# Patient Record
Sex: Male | Born: 1975 | Race: White | Hispanic: No | Marital: Married | Smoking: Current every day smoker
Health system: Southern US, Community
[De-identification: ages and names within clinical notes are randomized; demographics above are authoritative.]

## PROBLEM LIST (undated history)

## (undated) DIAGNOSIS — E669 Obesity, unspecified: Secondary | ICD-10-CM

## (undated) DIAGNOSIS — I10 Essential (primary) hypertension: Secondary | ICD-10-CM

## (undated) DIAGNOSIS — T7840XA Allergy, unspecified, initial encounter: Secondary | ICD-10-CM

## (undated) HISTORY — DX: Essential (primary) hypertension: I10

## (undated) HISTORY — PX: HERNIA REPAIR: SHX51

## (undated) HISTORY — PX: SPINE SURGERY: SHX786

## (undated) HISTORY — PX: APPENDECTOMY: SHX54

## (undated) HISTORY — PX: BACK SURGERY: SHX140

## (undated) HISTORY — DX: Allergy, unspecified, initial encounter: T78.40XA

## (undated) HISTORY — DX: Obesity, unspecified: E66.9

## (undated) HISTORY — PX: TONSILLECTOMY: SUR1361

---

## 1999-07-13 HISTORY — PX: TUMOR REMOVAL: SHX12

## 2006-06-14 ENCOUNTER — Encounter: Admission: RE | Admit: 2006-06-14 | Discharge: 2006-06-14 | Payer: Self-pay | Admitting: Neurosurgery

## 2006-08-10 ENCOUNTER — Inpatient Hospital Stay (HOSPITAL_COMMUNITY): Admission: RE | Admit: 2006-08-10 | Discharge: 2006-08-18 | Payer: Self-pay | Admitting: Neurosurgery

## 2006-08-10 ENCOUNTER — Ambulatory Visit: Payer: Self-pay | Admitting: Emergency Medicine

## 2006-08-15 ENCOUNTER — Ambulatory Visit: Payer: Self-pay | Admitting: Physical Medicine & Rehabilitation

## 2006-08-17 ENCOUNTER — Ambulatory Visit (HOSPITAL_COMMUNITY): Admission: RE | Admit: 2006-08-17 | Discharge: 2006-08-17 | Payer: Self-pay | Admitting: Emergency Medicine

## 2006-08-17 ENCOUNTER — Encounter (INDEPENDENT_AMBULATORY_CARE_PROVIDER_SITE_OTHER): Payer: Self-pay | Admitting: Specialist

## 2006-08-18 ENCOUNTER — Encounter (INDEPENDENT_AMBULATORY_CARE_PROVIDER_SITE_OTHER): Payer: Self-pay | Admitting: *Deleted

## 2006-09-02 ENCOUNTER — Ambulatory Visit: Payer: Self-pay | Admitting: Internal Medicine

## 2006-09-06 ENCOUNTER — Ambulatory Visit: Payer: Self-pay | Admitting: Emergency Medicine

## 2010-11-24 NOTE — H&P (Signed)
NAME:  [name], [name] NO.:  [id number]   MEDICAL RECORD NO.:  [id number]          PATIENT TYPE:  INP   LOCATION:  3172                         FACILITY:  [facility]   PHYSICIAN:  Hilda Lias, M.D.   DATE OF BIRTH:  December 19, 1975   DATE OF ADMISSION:  08/10/2006  DATE OF DISCHARGE:                              HISTORY & PHYSICAL   Mr. Humber is a gentleman who was seen in my office in November of  2007 and later on two weeks ago because of chronic complaint of back  pain with radiation to the left leg at the beginning and then down to  the right leg since he was in an accident back on September 30, 2005.  Patient had every single conservative treatment including medication,  physical therapy, epidural injection with no improvement.  He is getting  worse.  We saw him, we talked to him, we did a discogram and because of  the findings, he wants to proceed with surgery.   PAST SURGICAL HISTORY:  1. Tonsillectomy.  2. 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   HE IS ALLERGIC TO SULFA, ERYTHROMYCIN AND PENICILLIN.   Patient drinks socially, he smokes a pack a day.   FAMILY HISTORY:  There is a history of high blood pressure in his  family.   REVIEW OF SYSTEMS:  Positive back pain and leg pain.   PHYSICAL EXAMINATION:  HEAD/EARS/NOSE/THROAT:  Normal.  NECK:  Normal.  LUNGS:  Clear.  HEART SOUNDS:  Normal.  ABDOMEN:  Normal.  EXTREMITIES:  Normal pulses.  NEURO/MENTAL STATUS EXAM:  Psychologically normal.  He  has decreased in flexibility of the lower spine.  There is straight leg  raising, SLR is positive about 30 degrees bilaterally.  He has a mild  weak dorsiflexion.   Lumbar spine x-ray shows severe case of degenerative disc disease at L4-  L5 and L5-S1.  Discogram was highly positive for abnormality in the disc  at the levels 4-5, 5-1 with 3-4 being completely normal.   CLINICAL IMPRESSION:  Degenerative disc disease L4-5, L5-S1 with chronic  radiculopathy.   RECOMMENDATIONS:  The patient is being admitted for surgery.  Procedure  will be bilateral 4-5, 5-1 discectomy, interbody fusion with cage and  pedicle screws.  He knows about the risks such as infection, CSF leak,  no improvement whatsoever, need for further surgery, failure to heal  because the history of smoking plus obesity, damage to the vessels of  the abdomen, failure of the hardware.           ______________________________  Hilda Lias, M.D.    EB/MEDQ  D:  08/10/2006  T:  08/10/2006  Job:  [id number]

## 2010-11-24 NOTE — Op Note (Signed)
NAME:  JAD, JOHANSSON             ACCOUNT NO.:  [id number]   MEDICAL RECORD NO.:  [id number]          PATIENT TYPE:  INP   LOCATION:  5022                         FACILITY:  [facility]   PHYSICIAN:  Hilda Lias, M.D.   DATE OF BIRTH:  [birth year]   DATE OF PROCEDURE:  08/10/2006  DATE OF DISCHARGE:                               OPERATIVE REPORT   PREOPERATIVE DIAGNOSIS:  Degenerative disk disease, L4-L5, L5-S1, with a  chronic radiculopathy.   POSTOPERATIVE DIAGNOSIS:  Degenerative disk disease, L4-L5, L5-S1, with  a chronic radiculopathy.   PROCEDURE PERFORMED:  Bilateral L4-L5 laminectomy and facetectomy;  bilateral L4-L5 diskectomy; interbody fusion with cages; pedicle screws  L4-L5 and L5-S1; posterolateral arthrodesis; video, Cell-Saver, C-arm.   SURGEON:  Hilda Lias, M.D.   ASSISTANT SURGEON:  Coletta Memos, M.D.   CLINICAL HISTORY:  The patient was admitted because of back pain  radiating to both legs.  This problem has been going on for a year after  he fell at work.  He has failed with conservative treatment.  Surgery  was advised.  The risks were explained to him on physical.   DESCRIPTION OF PROCEDURE:  Mr. Shorey was taken to the operating room.  He was prepped appropriately.  The skin was cleaned with DuraPrep.  Drapes were applied.  An incision from L3 down to L5-S1 was made.  Bulky  muscles were protected all the way laterally.  X-ray showed that indeed  we were at the level of L5-S1.  From then on, we removed the spinous  processes off of L4 and L5 as well as the lamina and the facet.  We  found quite a bit of adhesions.  We entered the disk space at the level  of L4-L5 bilaterally and large amounts of degenerative disk were  removed.  Total diskectomy was done with removal of the endplate.  From  then on, we proceeded at the level of L5-S1 with the same findings of  degenerative disk.   Having the cleaned the disk spaces with curets, we introduced at  the  level of L4-L5 two cages of 14 x 22 with autograft and BMP inside.  At  the level of L5-S1, we introduced two __________ of 12 x 22.  Then in  the midline as well as laterally, a mix of BMP and autograft was used to  accomplish the fusion.  Then using the C-arm, first in the AP view and  then laterally, we probed the pedicles of L4, L5 and S1.  This was  followed by a tap; and, at the end, we introduced six screws of 6.5 x  40.  AP and lateral showed good position of the pedicle screws.  We  investigated the other pedicles and there was no perforation of the  medial wall.  The nerves were in good position.  From then on, the  pedicle screws were secured in place using a rod with caps on top.  Then  we went laterally and we removed the periosteum of the transverse  processes of L4, L5, and the ala of the sacrum.  Then a mix  of BMP and  autograft was used for the arthrodesis.  The area was irrigated.  Fentanyl was left in the epidural space.  The wound was closed with  Vicryl and Steri-Strips.          ______________________________  Hilda Lias, M.D.    EB/MEDQ  D:  08/10/2006  T:  08/11/2006  Job:  [id number]

## 2010-11-27 NOTE — Assessment & Plan Note (Signed)
[facility]                             PULMONARY OFFICE NOTE   NAME:[name], [name]                    MRN:          [id number]  DATE:09/02/2006                            DOB:          [birth year]    HISTORY OF PRESENT ILLNESS:  This is a 35 year old male patient that was  recently hospitalized, January 30 through August 18, 2006, for back  surgery.  The patient had back pain, radiating to both legs, ongoing for  several months after he had an accident prior to his admission.  The  patient was found to have degenerative disc disease and underwent an L4-  L5, L5-S1 fusion.  Immediately after surgery, the patient developed a  high temperature and chest x-ray showed infiltrates, consistent with  pneumonia.  The patient was started on antibiotic therapy and pulmonary  consult was obtained.  The patient was seen by Dr. Delton Coombes and  subsequently underwent a bronchoscopy on August 18, 2006.  Pathology  was negative.  Since discharge, patient reports that he has continued to  have a persistent cough, despite finishing his antibiotics.  The patient  denies any purulent sputum, fever, chest pain, shortness of breath or  hemoptysis.   PAST MEDICAL HISTORY:  Degenerative disc disease, status post surgery,  as above.  Otherwise past medical history is unremarkable.   CURRENT MEDICATIONS:  1. Percocet p.r.n.  2. Diovan p.r.n.  3. Neurontin daily.   DRUG ALLERGIES:  No known drug allergies.   SOCIAL HISTORY:  The patient is single, has one child.  Prior to  admission, drank about a six-pack of beer a week.  Denies any drug use.  The patient does smoke approximately one pack of cigarettes a day over  the last 10-12 years.   FAMILY HISTORY:  Negative for cancer, diabetes or lung disease.  Positive for coronary artery disease.   PHYSICAL EXAM:  The patient is an obese male, in no acute distress.  He  is afebrile with stable vital signs.  HEENT:   Unremarkable.  NECK:  Supple without cervical adenopathy.  No JVD.  LUNGS:  The lung fields reveal coarse breath sounds bilaterally, without  any wheezing or crackles.  CARDIAC:  Regular rate and rhythm.  ABDOMEN:  Soft and nontender and obese.  The patient does have a back  corset on.  EXTREMITIES:  Warm without any calf tenderness, cyanosis, clubbing.  There is trace edema with venous insufficiency changes.   DATA:  Chest x-ray on August 15, 2006, showed right upper lobe  infiltrate.  Bronch:  Negative AFB.  Blood culture times two negative.  Respiratory culture was negative.  Negative yeast or fungus.  Negative  P. carinii.   IMPRESSION AND PLAN:  Right upper and right lower lobe infiltrates,  consistent with pneumonia.  The patient is now status post bronchoscopy  with negative pathology.  The patient has now finished a course of  antibiotics and does appear to be improving; however, has a persistent  cough.  The patient was sent down for a followup chest x-ray, which is  pending at the time of this  dictation.  The patient will begin on  Mucinex DM twice daily.  May use Tessalon Perles as needed for cough.  To note:  The patient is taking Percocet on a pretty regular basis for  his pain management for his back surgery.  I did not want to add any  other narcotic cough syrup at this time.  We will add in Protonix 40 mg  daily for any residual reflux that could be irritating the airways from  persistent coughing.  The patient will return back next week with Dr.  Delton Coombes as scheduled, or sooner, if needed.      Corey Oaks, NP  Electronically Signed      Leslye Peer, MD  Electronically Signed   TP/MedQ  DD: 09/02/2006  DT: 09/02/2006  Job #: [id number]

## 2010-11-27 NOTE — Op Note (Signed)
NAME:  [name], [name]             ACCOUNT NO.:  [id number]   MEDICAL RECORD NO.:  [id number]          PATIENT TYPE:  INP   LOCATION:  5022                         FACILITY:  [facility]   PHYSICIAN:  Leslye Peer, MD    DATE OF BIRTH:  08-05-1975   DATE OF PROCEDURE:  08/24/2006  DATE OF DISCHARGE:  08/18/2006                               OPERATIVE REPORT   PROCEDURES:  Fiberoptic bronchoscopy with lavage and biopsies.   OPERATOR:  Levy Pupa, M.D.   INDICATIONS:  Right upper lobe and right lower lobe infiltrates on CAT  scan.   MEDICATIONS GIVEN:  Fentanyl 150 mcg IV in divided doses, Versed 12 mg  IV in divided doses.  1% lidocaine to the bronchoalveolar tree 2%  lidocaine to the right naris.   CONSENT:  This was obtained from the patient.  A signed copy is on his  hospital chart.   PROCEDURE DETAILS:  After informed consent was obtained, conscious  sedation was initiated as indicated above fiberoptic bronchoscope was  initially introduced to the right naris, but would not pass it was  subsequently introduced successfully to the oropharynx.  The posterior  pharynx was well visualized.  The cords were within normal limits and  moved normally with phonation and with deep inspiration.  Trachea was  intubated without difficulty.  Local anesthesia was achieved with 1%  lidocaine, the trachea and bilateral mainstem bronchi were inspected.  The left mainstem bronchus was within normal limits. The right mainstem  bronchus was within normal limits as well.  The left side was fully  examined including the left upper lobe and segmental bronchi and  lingular and left lower lobe bronchi and segmental bronchi.  There were  no endobronchial lesions or abnormal secretions noted.  The right-sided  exam showed no abnormalities in the right middle lobe, right lower lobe,  bronchus intermedius or in the segmental airways.  Attention was then  turned to the right upper lobe.  The posterior  segment of the right  upper lobe showed tannish-white exudative secretions.  There were no  endobronchial lesions noted.  Secretions were easily suctioned.  Brushings for microbiology were taken from posterior segment of the  right upper lobe and transbronchial biopsies were also performed on the  posterior segment of the right upper lobe.  Bronchoalveolar lavage was  performed in this region as well with 30 mL of normal saline instilled  and 10 mL returned.  This will be sent for microbiology.  A  postprocedural chest x-ray was pending at time of this dictation.  The  patient tolerated the procedure well.  There was approximately 2 mL  blood loss total with good hemostasis by the end of the case.  He  returned to the recovery room in good condition.   SAMPLES:  1. Bronchial brushings from the right upper lobe.  2. Transbronchial biopsies from the right upper lobe.  3. Bronchoalveolar lavage from the right upper lobe.   PLANS:  I will follow-up the results of [name] pathology and  microbiology with him in the office in approximately 10-14 days.  If  there are any abnormalities in his testing, I will call him sooner.      Leslye Peer, MD  Electronically Signed     RSB/MEDQ  D:  08/24/2006  T:  08/24/2006  Job:  [id number]

## 2010-11-27 NOTE — Assessment & Plan Note (Signed)
[facility]                             PULMONARY OFFICE NOTE   NAME:[name], [name]                    MRN:          [id number]  DATE:09/06/2006                            DOB:          07/30/1975    SUBJECTIVE:  [name] is a 35 year old gentleman seen initially in  the hospital for right upper lobe and lower lobe pulmonary infiltrates  following an elective back surgery performed by Dr. Jeral Fruit. I performed  fiberoptic bronchoscopy to further evaluate the nodular infiltrates.  Pathology and microbiology have all been negative to date. He returns  today for a regular followup of a dry cough that has been problematic  since his hospitalization. He is otherwise doing well, he is not having  any dyspnea. He continues to cough and has paroxysms especially at night  when he lies flat. He is not producing any sputum. He denies chest pain  but does have back pain when his cough is severe at his surgical site.   MEDICATIONS:  1. Percocet p.r.n.  2. Neurontin 300 mg t.i.d.  3. Protonix 40 mg daily.   PHYSICAL EXAMINATION:  GENERAL:  This is an obese, comfortable gentleman  in no distress.  VITAL SIGNS:  His weight is 288 pounds, temperature 97.6, blood pressure  112/78, heart rate 95, SPO2 98% on room air.  HEENT:  His oropharynx is clear. There is mild posterior pharyngeal  erythema but no lesion noted.  NECK:  Supple without lymphadenopathy or stridor.  LUNGS:  Clear to auscultation bilaterally.  HEART:  Regular rate and rhythm without murmur.  ABDOMEN:  Benign. He has a back brace in place.  EXTREMITIES:  No cyanosis, clubbing or edema.  NEUROLOGIC:  Grossly nonfocal exam.   Chest x-ray performed on September 02, 2006 on an office visit showed  resolution of his right upper lobe and lower lobe parenchymal  infiltrates. His transbronchial biopsies and the microbiology from his  brushings and bronchioalveolar lavage from our bronchoscopy are  all  negative. Cultures for AFB are pending.   IMPRESSION:  1. Resolved right upper lobe and lower lobe aspiration pneumonitis      following his general anesthesia for his back surgery.  2. Cough that appears to be influenced primarily by gastroesophageal      reflux disease although there may also be a component of some upper      airway irritation due to his recent intubation.   PLAN:  1. Discontinue Mucinex.  2. Continue Protonix 40 mg daily.  3. I will start Tussionex for shortterm symptomatic relief.  4. I expect his cough to resolve with the interventions above. He will      return to see me if his cough persists.  5. I will followup [name] AFB results to completion and then      call him with the final report.     Leslye Peer, MD  Electronically Signed    RSB/MedQ  DD: 09/06/2006  DT: 09/06/2006  Job #: [id number]   cc:   Hilda Lias, M.D.

## 2010-11-27 NOTE — Discharge Summary (Signed)
NAME:  [name], [name]             ACCOUNT NO.:  [id number]   MEDICAL RECORD NO.:  [id number]          PATIENT TYPE:  INP   LOCATION:  5022                         FACILITY:  [facility]   PHYSICIAN:  Hilda Lias, M.D.   DATE OF BIRTH:  1975-08-30   DATE OF ADMISSION:  08/10/2006  DATE OF DISCHARGE:  08/18/2006                               DISCHARGE SUMMARY   ADMISSION DIAGNOSIS:  Degenerative disk disease, L4-5, L5-S1 with a  chronic back pain and chronic radiculopathy.   DISCHARGE DIAGNOSES:  1. Degenerative disk disease, L4-5, L5-S1 with a chronic back pain and      chronic radiculopathy.  2. Pneumonia.   HISTORY:  The patient was admitted because of back pain radiating to  both the legs which had been going on for several months after the  accident.  He had failed with conservative treatment.  Because of the  findings, surgery was advised.   Laboratory showed the white cells were elevated to 11,000.  The x-ray of  the chest showed 2 pneumonias.  The blood cultures were negative.   HOSPITAL COURSE:  The patient was taken to surgery and L4-5 and L5-S1  diskectomy and fusion was done.  Immediately after surgery, the patient  developed high temperature.  He was found by x-ray that he had two  pneumonias.  The patient has a history of pneumonia.  He was seen by  pulmo.  He had a bronchoscopy yesterday.  Today he is feeling great.  Has no fever.  He wants to go home.  We are going to contact [facility] to see if they are ready to send him home and if that is  the case, they will give him the medication that he would require at  home for the pneumonia.   CONDITION ON DISCHARGE:  Improving.   MEDICATIONS:  From out point of view:  Percocet, diazepam and Neurontin.  We will wait for the pulmonologist to add if any antibiotics.   DIET:  He was encouraged to lose weight.   ACTIVITY:  Not to drive for at least a week.   FOLLOW UP:  He is going to see me in five to six  weeks or as needed.  The [facility] will be giving their own appointment.           ______________________________  Hilda Lias, M.D.    EB/MEDQ  D:  08/18/2006  T:  08/19/2006  Job:  [id number]

## 2013-05-04 ENCOUNTER — Ambulatory Visit: Payer: Self-pay | Admitting: Family Medicine

## 2013-05-04 LAB — DOT URINE DIP
Protein: NEGATIVE
Specific Gravity: >=1.03 (ref 1.003–1.030)

## 2014-02-27 ENCOUNTER — Ambulatory Visit: Payer: Self-pay

## 2015-09-10 ENCOUNTER — Ambulatory Visit
Admission: EM | Admit: 2015-09-10 | Discharge: 2015-09-10 | Disposition: A | Discharge status: Home / Self Care | Payer: 59 | Attending: Family Medicine | Admitting: Family Medicine

## 2015-09-10 ENCOUNTER — Ambulatory Visit (INDEPENDENT_AMBULATORY_CARE_PROVIDER_SITE_OTHER): Payer: 59

## 2015-09-10 ENCOUNTER — Encounter: Payer: Self-pay | Admitting: Emergency Medicine

## 2015-09-10 DIAGNOSIS — S93401A Sprain of unspecified ligament of right ankle, initial encounter: Secondary | ICD-10-CM | POA: Diagnosis not present

## 2015-09-10 MED ORDER — OXYCODONE-ACETAMINOPHEN 5-325 MG PO TABS: 1.0000 | ORAL_TABLET | Freq: Three times a day (TID) | ORAL | Status: DC | PRN

## 2015-09-10 MED ORDER — IBUPROFEN 800 MG PO TABS: 800.0000 mg | ORAL_TABLET | Freq: Three times a day (TID) | ORAL | Status: DC | PRN

## 2015-09-10 NOTE — ED Provider Notes (Signed)
[facility]  ____________________________________________  Time seen: Approximately 4:10 PM  I have reviewed the triage vital signs and the nursing notes.   HISTORY  Chief Complaint Ankle Pain   HPI [name] is a 40 y.o. male presents with a complaint of right ankle pain since yesterday. Patient reports that he was walking outside yesterday after it had rained. Patient states he was walking across the grass where it was wet and states that his foot slid in the wet grass calling him to fall. Patient states that he rolled his right ankle in the process of falling. Denies head injury or loss of consciousness. Denies other pain or injury.  Reports he has had right ankle pain since the fall. Patient states he only fell because he slipped. States felt fine just prior to the fall as well as felt fine after the fall except for right ankle pain. Reports has continued to ambulate on right ankle but with some pain. States has continued to go to work.  States current right ankle pain is 6 out of 10 aching and worse with ambulation. States pain improves with rest. Denies pain radiation. Denies numbness or tingling sensation. Denies neck pain, back pain, headache, loss of consciousness, other extremity pain or injury, rash, chest pain, shortness of breath, dizziness, weakness, palpitations or abdominal pain.   History reviewed. No pertinent past medical history.  There are no active problems to display for this patient.   Past Surgical History  Procedure Laterality Date  . Back surgery    . Tonsillectomy    . Appendectomy    . Hernia repair      No current outpatient prescriptions on file.  Allergies Erythromycin; Penicillins; and Sulfa antibiotics  History reviewed. No pertinent family history.  Social History Social History  Substance Use Topics  . Smoking status: Current Every Day Smoker -- 1.00 packs/day    Types: Cigarettes  . Smokeless tobacco: None  .  Alcohol Use: Yes    Review of Systems Constitutional: No fever/chills Eyes: No visual changes. ENT: No sore throat. Cardiovascular: Denies chest pain. Respiratory: Denies shortness of breath. Gastrointestinal: No abdominal pain.  No nausea, no vomiting.  No diarrhea.  No constipation. Genitourinary: Negative for dysuria. Musculoskeletal: Negative for back pain.right ankle pain.  Skin: Negative for rash. Neurological: Negative for headaches, focal weakness or numbness.  10-point ROS otherwise negative.  ____________________________________________   PHYSICAL EXAM:  VITAL SIGNS: ED Triage Vitals  Enc Vitals Group     BP 09/10/15 1539 125/76 mmHg     Pulse Rate 09/10/15 1539 97     Resp -- 18     Temp 09/10/15 1539 98.8 F (37.1 C)     Temp Source 09/10/15 1539 Oral     SpO2 09/10/15 1539 98 %     Weight 09/10/15 1539 320 lb (145.151 kg)     Height 09/10/15 1539 6\' 3"  (1.905 m)     Head Cir --      Peak Flow --      Pain Score 09/10/15 1542 7     Pain Loc --      Pain Edu? --      Excl. in GC? --    Constitutional: Alert and oriented. Well appearing and in no acute distress. Eyes: Conjunctivae are normal. PERRL. EOMI. Head: Atraumatic.  Nose: No congestion/rhinnorhea.  Mouth/Throat: Mucous membranes are moist.  Oropharynx non-erythematous. Neck: No stridor.  No cervical spine tenderness to palpation. Cardiovascular: Normal rate, regular rhythm.  Grossly normal heart sounds.  Good peripheral circulation. Respiratory: Normal respiratory effort.  No retractions. Lungs CTAB. Gastrointestinal: Soft and nontender. No distention. Normal Bowel sounds.  No abdominal bruits. No CVA tenderness. Musculoskeletal: No lower or upper extremity tenderness nor edema.  No joint effusions. Bilateral pedal pulses equal and easily palpated. No cervical, thoracic or lumbar tenderness to palpation. Except: Right lateral ankle mild to moderate tender sensation at the lateral malleolus with mild  swelling, no ecchymosis, no erythema, no induration or fluctuance, skin intact, mild to moderate pain with ankle rotation and plantar flexion and dorsiflexion but with full range of motion present, cap refill less than 2 seconds to all distal right foot toes, no tendon or motor or sensation deficit to right foot. No medial ankle tenderness. Right lower extremity  otherwise nontender.  Negative Thompson's squeeze test. Achilles tendon reflex intact and equal bilaterally. Ambulatory in room with minimal antalgic gait. Neurologic:  Normal speech and language. No gross focal neurologic deficits are appreciated. No gait instability. Skin:  Skin is warm, dry and intact. No rash noted. Psychiatric: Mood and affect are normal. Speech and behavior are normal.  ____________________________________________   LABS (all labs ordered are listed, but only abnormal results are displayed)  Labs Reviewed - No data to display  RADIOLOGY  EXAM: RIGHT ANKLE - COMPLETE 3+ VIEW  COMPARISON: None.  FINDINGS: No acute fracture is seen. The ankle joint appears normal. Alignment is normal. There is a small bony density just beneath the medial malleolus which appears well corticated and most likely is old. Degenerative calcaneal spurs are noted.  IMPRESSION: 1. No definite acute fracture. Probable old avulsion from the medial malleolar tip. 2. Degenerative calcaneal spurs.   Electronically Signed By: Dwyane Dee M.D. On: 09/10/2015 15:56  I, Renford Dills, personally discussed these images and results by phone with the on-call radiologist and used this discussion as part of my medical decision making.   ____________________________________________   PROCEDURES  Procedure(s) performed:  Right stirrup velcro splint applied by RN. Neurovascular intact post application.  ____________________________________________   INITIAL IMPRESSION / ASSESSMENT AND PLAN / ED COURSE  Pertinent labs &  imaging results that were available during my care of the patient were reviewed by me and considered in my medical decision making (see chart for details).  Well appearing. Ambulatory in room. No acute distress. Presents for right ankle pain post mechanical fall after slipping on wet grass yesterday. Denies head injury, loss of consciousness or other injury. Right lateral ankle tenderness, Right lower extremity otherwise nontender.   Right ankle xray no definite acute fracture, probable old avulsion from medial malleolar tip, degenerative calcaneal spurs per radiology.   Reviewed xray, no medial malleolar tenderness on exam and continued no medial tenderness after reexmination, suspect old injury medially. No acute bony abnormality. Suspect sprain injury. Splint applied. Patient states he does not want crutches from this facility at this time, as his friend has crutches he can use. Discussed rest, ice, elevation and splint as long as pain continues. Prn ibuprofen and percocet qty 5 given, and directed do not drive within 6 hours of taking. Follow up with orthopedic or PCP as needed for continued pain, ortho information given.   Discussed follow up with Primary care physician this week. Discussed follow up and return parameters including no resolution or any worsening concerns. Patient verbalized understanding and agreed to plan.   ____________________________________________   FINAL CLINICAL IMPRESSION(S) / ED DIAGNOSES  Final diagnoses:  Right ankle sprain, initial  encounter      Note: This dictation was prepared with Dragon dictation along with smaller phrase technology. Any transcriptional errors that result from this process are unintentional.    Renford Dills, NP 09/12/15 1706

## 2015-09-10 NOTE — ED Notes (Signed)
Patient c/o pain in his right ankle after falling down a hill yesterday.

## 2015-09-10 NOTE — Discharge Instructions (Signed)
Take medication as prescribed. Do not drive within 6 hours of pain medication. Apply ice and elevate. Wear splint and use crutches. Rest ankle.   Follow up with orthopedic next week as needed for continued pain. See above.   Follow up with your primary care physician this week as needed. Return to Urgent care for new or worsening concerns.   Ankle Sprain An ankle sprain is an injury to the strong, fibrous tissues (ligaments) that hold the bones of your ankle joint together.  CAUSES An ankle sprain is usually caused by a fall or by twisting your ankle. Ankle sprains most commonly occur when you step on the outer edge of your foot, and your ankle turns inward. People who participate in sports are more prone to these types of injuries.  SYMPTOMS   Pain in your ankle. The pain may be present at rest or only when you are trying to stand or walk.  Swelling.  Bruising. Bruising may develop immediately or within 1 to 2 days after your injury.  Difficulty standing or walking, particularly when turning corners or changing directions. DIAGNOSIS  Your caregiver will ask you details about your injury and perform a physical exam of your ankle to determine if you have an ankle sprain. During the physical exam, your caregiver will press on and apply pressure to specific areas of your foot and ankle. Your caregiver will try to move your ankle in certain ways. An X-ray exam may be done to be sure a bone was not broken or a ligament did not separate from one of the bones in your ankle (avulsion fracture).  TREATMENT  Certain types of braces can help stabilize your ankle. Your caregiver can make a recommendation for this. Your caregiver may recommend the use of medicine for pain. If your sprain is severe, your caregiver may refer you to a surgeon who helps to restore function to parts of your skeletal system (orthopedist) or a physical therapist. HOME CARE INSTRUCTIONS   Apply ice to your injury for 1-2 days or  as directed by your caregiver. Applying ice helps to reduce inflammation and pain.  Put ice in a plastic bag.  Place a towel between your skin and the bag.  Leave the ice on for 15-20 minutes at a time, every 2 hours while you are awake.  Only take over-the-counter or prescription medicines for pain, discomfort, or fever as directed by your caregiver.  Elevate your injured ankle above the level of your heart as much as possible for 2-3 days.  If your caregiver recommends crutches, use them as instructed. Gradually put weight on the affected ankle. Continue to use crutches or a cane until you can walk without feeling pain in your ankle.  If you have a plaster splint, wear the splint as directed by your caregiver. Do not rest it on anything harder than a pillow for the first 24 hours. Do not put weight on it. Do not get it wet. You may take it off to take a shower or bath.  You may have been given an elastic bandage to wear around your ankle to provide support. If the elastic bandage is too tight (you have numbness or tingling in your foot or your foot becomes cold and blue), adjust the bandage to make it comfortable.  If you have an air splint, you may blow more air into it or let air out to make it more comfortable. You may take your splint off at night and before taking  a shower or bath. Wiggle your toes in the splint several times per day to decrease swelling. SEEK MEDICAL CARE IF:   You have rapidly increasing bruising or swelling.  Your toes feel extremely cold or you lose feeling in your foot.  Your pain is not relieved with medicine. SEEK IMMEDIATE MEDICAL CARE IF:  Your toes are numb or blue.  You have severe pain that is increasing. MAKE SURE YOU:   Understand these instructions.  Will watch your condition.  Will get help right away if you are not doing well or get worse.   This information is not intended to replace advice given to you by your health care provider.  Make sure you discuss any questions you have with your health care provider.   Document Released: 06/28/2005 Document Revised: 07/19/2014 Document Reviewed: 07/10/2011 Elsevier Interactive Patient Education 2016 [employer]  Stirrup Ankle Brace Stirrup ankle braces give support and help stabilize the ankle joint. They are rigid pieces of plastic or fiberglass that go up both sides of the lower leg with the bottom of the stirrup fitting comfortably under the bottom of the instep of the foot. It can be held on with Velcro straps or an elastic wrap. Stirrup ankle braces are used to support the ankle following mild or moderate sprains or strains, or fractures after cast removal.  They can be easily removed or adjusted if there is swelling. The rigid brace shells are designed to fit the ankle comfortably and provide the needed medial/lateral stabilization. This brace can be easily worn with most athletic shoes. The brace liner is usually made of a soft, comfortable gel-like material. This gel fits the ankle well without causing uncomfortable pressure points.  IMPORTANCE OF ANKLE BRACES:  The use of ankle bracing is effective in the prevention of ankle sprains.  In athletes, the use of ankle bracing will offer protection and prevent further sprains.  Research shows that a complete rehabilitation program needs to be included with external bracing. This includes range of motion and ankle strengthening exercises. Your caregivers will instruct you in this. If you were given the brace today for a new injury, use the following home care instructions as a guide. HOME CARE INSTRUCTIONS   Apply ice to the sore area for 15-20 minutes, 03-04 times per day while awake for the first 2 days. Put the ice in a plastic bag and place a towel between the bag of ice and your skin. Never place the ice pack directly on your skin. Be especially careful using ice on an elbow or knee or other bony area, such as your ankle,  because icing for too long may damage the nerves which are close to the surface.  Keep your leg elevated when possible to lessen swelling.  Wear your splint until you are seen for a follow-up examination. Do not put weight on it. Do not get it wet. You may take it off to take a shower or bath.  For Activity: Use crutches with non-weight bearing for 1 week. Then, you may walk on your ankle as instructed. Start gradually with weight bearing on the affected ankle.  Continue to use crutches or a cane until you can stand on your ankle without causing pain.  Wiggle your toes in the splint several times per day if you are able.  The splint is too tight if you have numbness, tingling, or if your foot becomes cold and blue. Adjust the straps or elastic bandage to make it comfortable.  Only take over-the-counter or prescription medicines for pain, discomfort, or fever as directed by your caregiver. SEEK IMMEDIATE MEDICAL CARE IF:   You have increased bruising, swelling or pain.  Your toes are blue or cold and loosening the brace or wrap does not help.  Your pain is not relieved with medicine. MAKE SURE YOU:   Understand these instructions.  Will watch your condition.  Will get help right away if you are not doing well or get worse.   This information is not intended to replace advice given to you by your health care provider. Make sure you discuss any questions you have with your health care provider.   Document Released: 04/28/2004 Document Revised: 09/20/2011 Document Reviewed: 01/28/2015 Elsevier Interactive Patient Education [employer]

## 2016-12-01 ENCOUNTER — Encounter: Payer: Self-pay | Admitting: *Deleted

## 2016-12-01 ENCOUNTER — Ambulatory Visit
Admission: EM | Admit: 2016-12-01 | Discharge: 2016-12-01 | Disposition: A | Discharge status: Home / Self Care | Payer: 59 | Attending: Family Medicine | Admitting: Family Medicine

## 2016-12-01 ENCOUNTER — Ambulatory Visit (INDEPENDENT_AMBULATORY_CARE_PROVIDER_SITE_OTHER): Payer: 59

## 2016-12-01 DIAGNOSIS — M7581 Other shoulder lesions, right shoulder: Secondary | ICD-10-CM

## 2016-12-01 DIAGNOSIS — M25511 Pain in right shoulder: Secondary | ICD-10-CM

## 2016-12-01 DIAGNOSIS — M778 Other enthesopathies, not elsewhere classified: Secondary | ICD-10-CM

## 2016-12-01 MED ORDER — MELOXICAM 15 MG PO TABS: 15.0000 mg | ORAL_TABLET | Freq: Every day | ORAL | 0 refills | Status: AC | PRN

## 2016-12-01 NOTE — Discharge Instructions (Signed)
Take medication as prescribed. Rest. Drink plenty of fluids. Stretch. Avoid strenuous activity.  Follow up with orthopedic in one week for continued pain.   Follow up with your primary care physician this week as needed. Return to Urgent care for new or worsening concerns.

## 2016-12-01 NOTE — ED Provider Notes (Signed)
[facility] ____________________________________________  Time seen: Approximately 2:40 PM  I have reviewed the triage vital signs and the nursing notes.   HISTORY  Chief Complaint Shoulder Injury   HPI [name] is a 41 y.o. male procedure evaluation of right shoulder pain that has been present for the last 1.5 years. Patient reports that 1.5 years ago he was on the back of a transfer truck and he was sliding a box and trying to push it forward. Patient states that he moved his hand and under motion pushing box forward and states he had sudden onset of pain at that time. Patient states pain was previously worse than current. Patient states that the pain has been in the same area since injury. Patient states that he has since been trying to give it time but pain has continued prompting him to come in today. Patient reports pain at times fully resolves but then comes back and is worse with movement and aggravation. Patient reports he does still sometimes have pain at rest. Denies any pain radiation, paresthesias or other injury. Denies fall or direct trauma. Patient has not been previously seen for this. Patient reports he does occasionally take over-the-counter which does help with the pain. Reports he is right-hand dominant. Denies Worker's Comp. Patient reports otherwise feels well. States minimal pain to right shoulder at this time. Patient describes pain as an aching pain and sometimes throbbing. Denies other injury.   Denies chest pain, shortness of breath, abdominal pain, dysuria, extremity pain, extremity swelling or rash. Denies recent sickness. Denies recent antibiotic use.    History reviewed. No pertinent past medical history.  There are no active problems to display for this patient.   Past Surgical History:  Procedure Laterality Date  . APPENDECTOMY    . BACK SURGERY    . HERNIA REPAIR    . TONSILLECTOMY       No current facility-administered  medications for this encounter.   Current Outpatient Prescriptions:  .  ibuprofen (ADVIL,MOTRIN) 800 MG tablet, Take 1 tablet (800 mg total) by mouth every 8 (eight) hours as needed for mild pain or moderate pain., Disp: 15 tablet, Rfl: 0 .  meloxicam (MOBIC) 15 MG tablet, Take 1 tablet (15 mg total) by mouth daily as needed for pain., Disp: 14 tablet, Rfl: 0  Allergies Erythromycin; Penicillins; and Sulfa antibiotics  History reviewed. No pertinent family history.  Social History Social History  Substance Use Topics  . Smoking status: Current Every Day Smoker    Packs/day: 1.00    Types: Cigarettes  . Smokeless tobacco: Never Used  . Alcohol use Yes    Review of Systems Constitutional: No fever/chills Cardiovascular: Denies chest pain. Respiratory: Denies shortness of breath. Gastrointestinal: No abdominal pain.   Musculoskeletal: Negative for back pain. Skin: Negative for rash.   ____________________________________________   PHYSICAL EXAM:  VITAL SIGNS: ED Triage Vitals  Enc Vitals Group     BP 12/01/16 1357 (!) 142/88     Pulse Rate 12/01/16 1357 78     Resp 12/01/16 1357 16     Temp 12/01/16 1357 98.3 F (36.8 C)     Temp Source 12/01/16 1357 Oral     SpO2 12/01/16 1357 98 %     Weight 12/01/16 1400 300 lb (136.1 kg)     Height 12/01/16 1400 6\' 3"  (1.905 m)     Head Circumference --      Peak Flow --      Pain Score 12/01/16  1401 3     Pain Loc --      Pain Edu? --      Excl. in GC? --     Constitutional: Alert and oriented. Well appearing and in no acute distress. Cardiovascular: Normal rate, regular rhythm. Grossly normal heart sounds.  Good peripheral circulation. Respiratory: Normal respiratory effort without tachypnea nor retractions. Breath sounds are clear and equal bilaterally. No wheezes, rales, rhonchi. Musculoskeleta; No midline cervical, thoracic or lumbar tenderness to palpation. Bilateral upper extremities nontender with full range of  motion. Bilateral hand grips strong and equal. Bilateral distal hands normal distal sensation capillary refill, bilateral distal radial pulses equal and easily palpated and with normal motor tendon function. Bilateral upper extremities with equal sensation bilaterally. Except: Right anterior shoulder minimal tenderness to direct palpation, no bony tenderness, no swelling, no ecchymosis, no erythema, no surrounding tissue tenderness, pain increases with right abduction, no pain with forward lifting, negative drop arm test, negative Hawkins test.  Neurologic:  Normal speech and language. Speech is normal. No gait instability.  Skin:  Skin is warm, dry and intact. No rash noted. Psychiatric: Mood and affect are normal. Speech and behavior are normal. Patient exhibits appropriate insight and judgment   ___________________________________________   LABS (all labs ordered are listed, but only abnormal results are displayed)  Labs Reviewed - No data to display ____________________________________________  RADIOLOGY  Dg Shoulder Right  Result Date: 12/01/2016 CLINICAL DATA:  Right shoulder pain EXAM: RIGHT SHOULDER - 2+ VIEW COMPARISON:  None. FINDINGS: No fracture or dislocation is seen. The joint spaces are preserved. The visualized soft tissues are unremarkable. Visualized right lung is clear. IMPRESSION: Negative. Electronically Signed   By: Charline Bills M.D.   On: 12/01/2016 15:26   ____________________________________________   PROCEDURES Procedures   . INITIAL IMPRESSION / ASSESSMENT AND PLAN / ED COURSE  Pertinent labs & imaging results that were available during my care of the patient were reviewed by me and considered in my medical decision making (see chart for details).  Well-appearing patient in no acute distress. Patient presented for right shoulder pain that was after a mechanical injury 1.5 years ago. Patient reports pain initially improved after some time, but has never  fully resolved for long periods of time. Patient states over-the-counter medications to help and pain is worse with movements. Right shoulder x-ray is negative. Suspect remote tendon injury with secondary tendinitis. Discussed shoulder exercises. Will start patient on oral daily Mobic and encouraged orthopedic follow-up as needed for continued pain. Discussed indication, risks and benefits of medications with patient. Orthopedic information given.   Discussed follow up with Primary care physician this week. Discussed follow up and return parameters including no resolution or any worsening concerns. Patient verbalized understanding and agreed to plan.   ____________________________________________   FINAL CLINICAL IMPRESSION(S) / ED DIAGNOSES  Final diagnoses:  Acute pain of right shoulder  Tendinitis of right shoulder     Discharge Medication List as of 12/01/2016  3:36 PM    START taking these medications   Details  meloxicam (MOBIC) 15 MG tablet Take 1 tablet (15 mg total) by mouth daily as needed for pain., Starting Wed 12/01/2016, Until Wed 12/15/2016, Normal        Note: This dictation was prepared with Dragon dictation along with smaller phrase technology. Any transcriptional errors that result from this process are unintentional.         Renford Dills, NP 12/01/16 2208

## 2016-12-01 NOTE — ED Triage Notes (Signed)
Sudden onset right shoulder pain while pushing a box 1 1/2 years ago. Pain has persisted everyday since onset but varies in intensity.

## 2017-05-12 ENCOUNTER — Encounter: Payer: Self-pay | Admitting: Family Medicine

## 2017-05-12 ENCOUNTER — Ambulatory Visit (INDEPENDENT_AMBULATORY_CARE_PROVIDER_SITE_OTHER): Payer: Managed Care, Other (non HMO) | Admitting: Family Medicine

## 2017-05-12 VITALS — BP 127/84 | HR 93 | Temp 99.0°F | Ht 74.0 in | Wt 355.0 lb

## 2017-05-12 DIAGNOSIS — R200 Anesthesia of skin: Secondary | ICD-10-CM

## 2017-05-12 DIAGNOSIS — B353 Tinea pedis: Secondary | ICD-10-CM | POA: Diagnosis not present

## 2017-05-12 DIAGNOSIS — Z7689 Persons encountering health services in other specified circumstances: Secondary | ICD-10-CM | POA: Diagnosis not present

## 2017-05-12 DIAGNOSIS — Z716 Tobacco abuse counseling: Secondary | ICD-10-CM | POA: Diagnosis not present

## 2017-05-12 DIAGNOSIS — R208 Other disturbances of skin sensation: Secondary | ICD-10-CM

## 2017-05-12 DIAGNOSIS — R6884 Jaw pain: Secondary | ICD-10-CM

## 2017-05-12 MED ORDER — CLOTRIMAZOLE 1 % EX CREA: 1.0000 "application " | TOPICAL_CREAM | Freq: Two times a day (BID) | CUTANEOUS | 0 refills | Status: DC

## 2017-05-12 NOTE — Progress Notes (Signed)
BP 127/84   Pulse 93   Temp 99 F (37.2 C)   Ht 6\' 2"  (1.88 m)   Wt (!) 355 lb (161 kg)   SpO2 98%   BMI 45.58 kg/m    Subjective:    Patient ID: [name], male    DOB: Aug 26, 1975, 41 y.o.   MRN: [id number]  HPI: [name] is a 41 y.o. male  Chief Complaint  Patient presents with  . Establish Care  . Nicotine Dependence    interested in trying Chantix  . Thumb Pain    left thumb x 2 months. goes numb. If he "pops" his neck or back, the numbness goes away  . Rash    noticed some dark brown marks on the top of both of his feet yesterday.    Patient presents today to establish care. No known medical problems, not currently taking any medications. Has several concerns today.   Left facial/scalp stabbing pains intermittently for the past few weeks. Happens maybe once a day, lasts a few seconds. Thinks it may be related to his teeth so is going to the dentist in the next few days to get that checked out. No known injury, HAs, dizziness, visual changes.   Wanting to quit smoking. Has never tried any smoking cessation tools before. Interested in chantix.   Having some left thumb numbness that has been intermittent the past few years. 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 Numbness seems to alleviate when he pops his neck or back.   Started noticing some light brown patches on the tops of his feet yesterday. Has never seen them before. Denies itching, burning, peeling skin, chemical exposures.   Past Medical History:  Diagnosis Date  . Obesity    Social History   Socioeconomic History  . Marital status: Single    Spouse name: Not on file  . Number of children: Not on file  . Years of education: Not on file  . Highest education level: Not on file  Social Needs  . Financial resource strain: Not on file  . Food insecurity - worry: Not on file  . Food insecurity - inability: Not on  file  . Transportation needs - medical: Not on file  . Transportation needs - non-medical: Not on file  Occupational History  . Not on file  Tobacco Use  . Smoking status: Current Every Day Smoker    Packs/day: 1.00    Types: Cigarettes  . Smokeless tobacco: Never Used  Substance and Sexual Activity  . Alcohol use: Yes    Alcohol/week: 16.8 oz    Types: 7 Shots of liquor, 21 Cans of beer per week  . Drug use: No  . Sexual activity: Not on file  Other Topics Concern  . Not on file  Social History Narrative  . Not on file   Relevant past medical, surgical, family and social history reviewed and updated as indicated. Interim medical history since our last visit reviewed. Allergies and medications reviewed and updated.  Review of Systems  Constitutional: Negative.   HENT: Negative.   Respiratory: Negative.   Cardiovascular: Negative.   Gastrointestinal: Negative.   Genitourinary: Negative.   Musculoskeletal: Negative.   Skin: Positive for rash.  Neurological: Positive for numbness.  Psychiatric/Behavioral: Negative.    Per HPI unless specifically indicated above     Objective:    BP 127/84   Pulse 93  Temp 99 F (37.2 C)   Ht 6\' 2"  (1.88 m)   Wt (!) 355 lb (161 kg)   SpO2 98%   BMI 45.58 kg/m   Wt Readings from Last 3 Encounters:  05/12/17 (!) 355 lb (161 kg)  12/01/16 300 lb (136.1 kg)  09/10/15 (!) 320 lb (145.2 kg)    Physical Exam  Constitutional: He is oriented to person, place, and time. He appears well-developed and well-nourished. No distress.  HENT:  Head: Atraumatic.  Eyes: Conjunctivae are normal. Pupils are equal, round, and reactive to light. No scleral icterus.  Neck: Normal range of motion. Neck supple.  Cardiovascular: Normal rate and normal heart sounds.  Pulmonary/Chest: Effort normal and breath sounds normal. No respiratory distress.  Musculoskeletal: Normal range of motion. He exhibits no edema, tenderness or deformity.  Neurological:  He is alert and oriented to person, place, and time. No cranial nerve deficit.  Skin: Skin is warm and dry.  Psychiatric: He has a normal mood and affect. Thought content normal.  Nursing note and vitals reviewed.     Assessment & Plan:   Problem List Items Addressed This Visit    None    Visit Diagnoses    Numbness of left thumb    -  Primary   No pain or hx of injury to neck. Will start with chiropractic consult and treatments and monitor for benefit. Return precautions reviewed   Relevant Orders   Ambulatory referral to Chiropractic   Encounter to establish care       Tinea pedis of both feet       Clotrimazole cream sent, can ues OTC foot powders as well. Change socks mid-day, rotate shoes every other day.    Relevant Medications   clotrimazole (LOTRIMIN) 1 % cream   Encounter for smoking cessation counseling       Long discussion about options. Reviewed chantix cost and need for trying other means prior to that.Will start with patches/gum and habit replacement   Jaw pain       Await dental clearance. Will evaluate further if no dental problems and issue persists. Try massage, muscle rubs in the area.        Follow up plan: Return for CPE.

## 2017-05-15 NOTE — Patient Instructions (Signed)
Follow up for CPE 

## 2017-05-23 ENCOUNTER — Ambulatory Visit: Payer: Managed Care, Other (non HMO) | Admitting: Family Medicine

## 2017-05-23 ENCOUNTER — Encounter: Payer: Self-pay | Admitting: Family Medicine

## 2017-05-23 VITALS — BP 127/82 | HR 84 | Temp 98.1°F | Ht 76.0 in | Wt 355.2 lb

## 2017-05-23 DIAGNOSIS — Z0000 Encounter for general adult medical examination without abnormal findings: Secondary | ICD-10-CM | POA: Diagnosis not present

## 2017-05-23 LAB — UA/M W/RFLX CULTURE, ROUTINE
BILIRUBIN UA: NEGATIVE
Glucose, UA: NEGATIVE
LEUKOCYTES UA: NEGATIVE
Nitrite, UA: NEGATIVE
SPEC GRAV UA: 1.02 (ref 1.005–1.030)
Urobilinogen, Ur: 0.2 mg/dL (ref 0.2–1.0)
pH, UA: 6.5 (ref 5.0–7.5)

## 2017-05-23 LAB — MICROSCOPIC EXAMINATION

## 2017-05-23 NOTE — Patient Instructions (Signed)
Follow up as needed

## 2017-05-23 NOTE — Progress Notes (Signed)
BP 127/82 (BP Location: Right Arm, Patient Position: Sitting, Cuff Size: Large)   Pulse 84   Temp 98.1 F (36.7 C) (Temporal)   Ht 6\' 4"  (1.93 m)   Wt (!) 355 lb 3.2 oz (161.1 kg)   SpO2 98%   BMI 43.24 kg/m    Subjective:    Patient ID: [name], male    DOB: 11-09-1975, 41 y.o.   MRN: [id number]  HPI: [name] is a 41 y.o. male presenting on 05/23/2017 for comprehensive medical examination. 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 Negative test results.   Does not take flu shots.   Depression Screen done today and results listed below:  Depression screen [facility] 2/9 05/12/2017  Decreased Interest 0  Down, Depressed, Hopeless 0  PHQ - 2 Score 0    The patient does not have a history of falls. I did not complete a risk assessment for falls. A plan of care for falls was not documented.   Past Medical History:  Past Medical History:  Diagnosis Date  . Obesity     Surgical History:  Past Surgical History:  Procedure Laterality Date  . APPENDECTOMY    . BACK SURGERY    . HERNIA REPAIR    . TONSILLECTOMY    . 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 clotrimazole (LOTRIMIN) 1 % cream Apply 1 application topically 2 (two) times daily.  Marland Kitchen ibuprofen (ADVIL,MOTRIN) 200 MG tablet Take 200 mg every 6 (six) hours as needed by mouth.   No current facility-administered medications on file prior to visit.     Allergies:  Allergies  Allergen Reactions  . Erythromycin Anaphylaxis  . Penicillins Anaphylaxis  . Sulfa Antibiotics Anaphylaxis    Social History:  Social History   Socioeconomic History  . Marital status: Single    Spouse name: Not on file  . Number of children: Not on file  . Years of education: Not on file  . Highest education  level: Not on file  Social Needs  . Financial resource strain: Not on file  . Food insecurity - worry: Not on file  . Food insecurity - inability: Not on file  . Transportation needs - medical: Not on file  . Transportation needs - non-medical: Not on file  Occupational History  . Not on file  Tobacco Use  . Smoking status: Current Every Day Smoker    Packs/day: 1.00    Types: Cigarettes  . Smokeless tobacco: Never Used  Substance and Sexual Activity  . Alcohol use: Yes    Alcohol/week: 16.8 oz    Types: 7 Shots of liquor, 21 Cans of beer per week  . Drug use: No  . Sexual activity: Not on file  Other Topics Concern  . Not on file  Social History Narrative  . Not on file   Social History   Tobacco Use  Smoking Status Current Every Day Smoker  . Packs/day: 1.00  . Types: Cigarettes  Smokeless Tobacco Never Used   Social History   Substance and Sexual Activity  Alcohol Use Yes  . Alcohol/week: 16.8 oz  . Types: 7 Shots of liquor, 21 Cans of beer per week    Family History:  Family History  Problem Relation Age of  Onset  . Heart disease Paternal Grandfather   . COPD Neg Hx   . Diabetes Neg Hx   . Hypertension Neg Hx   . Stroke Neg Hx     Past medical history, surgical history, medications, allergies, family history and social history reviewed with patient today and changes made to appropriate areas of the chart.   Review of Systems - General ROS: negative Psychological ROS: negative Ophthalmic ROS: negative ENT ROS: negative Respiratory ROS: no cough, shortness of breath, or wheezing Cardiovascular ROS: no chest pain or dyspnea on exertion Gastrointestinal ROS: no abdominal pain, change in bowel habits, or black or bloody stools Genito-Urinary ROS: no dysuria, trouble voiding, or hematuria Musculoskeletal ROS: negative Neurological ROS: no TIA or stroke symptoms Dermatological ROS: negative All other ROS negative except what is listed above and in the HPI.       Objective:    BP 127/82 (BP Location: Right Arm, Patient Position: Sitting, Cuff Size: Large)   Pulse 84   Temp 98.1 F (36.7 C) (Temporal)   Ht 6\' 4"  (1.93 m)   Wt (!) 355 lb 3.2 oz (161.1 kg)   SpO2 98%   BMI 43.24 kg/m   Wt Readings from Last 3 Encounters:  05/23/17 (!) 355 lb 3.2 oz (161.1 kg)  05/12/17 (!) 355 lb (161 kg)  12/01/16 300 lb (136.1 kg)    Physical Exam  Constitutional: He is oriented to person, place, and time. No distress.  Obese  HENT:  Head: Atraumatic.  Right Ear: External ear normal.  Left Ear: External ear normal.  Nose: Nose normal.  Mouth/Throat: Oropharynx is clear and moist.  Eyes: Conjunctivae are normal. Pupils are equal, round, and reactive to light. No scleral icterus.  Neck: Normal range of motion. Neck supple.  Cardiovascular: Normal rate, regular rhythm, normal heart sounds and intact distal pulses.  No murmur heard. Pulmonary/Chest: Effort normal and breath sounds normal. No respiratory distress.  Abdominal: Soft. Bowel sounds are normal. He exhibits no distension and no mass. There is no tenderness. There is no guarding.  Musculoskeletal: Normal range of motion. He exhibits no edema or tenderness.  Neurological: He is alert and oriented to person, place, and time. He has normal reflexes.  Skin: Skin is warm and dry. No rash noted.  Psychiatric: He has a normal mood and affect. His behavior is normal.  Nursing note and vitals reviewed.   No results found for this or any previous visit.    Assessment & Plan:   Problem List Items Addressed This Visit    None    Visit Diagnoses    Annual physical exam    -  Primary   Non-fasting labs drawn today. Await results, has biometric form for work to be completed pending labs. No concerns today   Relevant Orders   CBC with Differential/Platelet   Comprehensive metabolic panel   Lipid Panel w/o Chol/HDL Ratio   UA/M w/rflx Culture, Routine       Discussed aspirin prophylaxis for  myocardial infarction prevention and decision was it was not indicated  LABORATORY TESTING:  Health maintenance labs ordered today as discussed above.   The natural history of prostate cancer and ongoing controversy regarding screening and potential treatment outcomes of prostate cancer has been discussed with the patient. The meaning of a false positive PSA and a false negative PSA has been discussed. He indicates understanding of the limitations of this screening test and wishes not to proceed with screening PSA testing.  IMMUNIZATIONS:   - Tdap: Tetanus vaccination status reviewed: last tetanus booster within 10 years. - Influenza: Refused  PATIENT COUNSELING:    Sexuality: Discussed sexually transmitted diseases, partner selection, use of condoms, avoidance of unintended pregnancy  and contraceptive alternatives.   Advised to avoid cigarette smoking.  I discussed with the patient that most people either abstain from alcohol or drink within safe limits (<=14/week and <=4 drinks/occasion for males, <=7/weeks and <= 3 drinks/occasion for females) and that the risk for alcohol disorders and other health effects rises proportionally with the number of drinks per week and how often a drinker exceeds daily limits.  Discussed cessation/primary prevention of drug use and availability of treatment for abuse.   Diet: Encouraged to adjust caloric intake to maintain  or achieve ideal body weight, to reduce intake of dietary saturated fat and total fat, to limit sodium intake by avoiding high sodium foods and not adding table salt, and to maintain adequate dietary potassium and calcium preferably from fresh fruits, vegetables, and low-fat dairy products.    stressed the importance of regular exercise  Injury prevention: Discussed safety belts, safety helmets, smoke detector, smoking near bedding or upholstery.   Dental health: Discussed importance of regular tooth brushing, flossing, and dental  visits.   Follow up plan: NEXT PREVENTATIVE PHYSICAL DUE IN 1 YEAR. Return in about 1 year (around 05/23/2018) for CPE.

## 2017-05-24 LAB — COMPREHENSIVE METABOLIC PANEL
A/G RATIO: 1.8 (ref 1.2–2.2)
ALT: 41 IU/L (ref 0–44)
AST: 22 IU/L (ref 0–40)
Albumin: 4.3 g/dL (ref 3.5–5.5)
Alkaline Phosphatase: 81 IU/L (ref 39–117)
BUN/Creatinine Ratio: 12 (ref 9–20)
BUN: 12 mg/dL (ref 6–24)
Bilirubin Total: 0.4 mg/dL (ref 0.0–1.2)
CALCIUM: 9 mg/dL (ref 8.7–10.2)
CO2: 23 mmol/L (ref 20–29)
CREATININE: 0.99 mg/dL (ref 0.76–1.27)
Chloride: 104 mmol/L (ref 96–106)
GFR, EST AFRICAN AMERICAN: 107 mL/min/{1.73_m2} (ref >59)
GFR, EST NON AFRICAN AMERICAN: 93 mL/min/{1.73_m2} (ref >59)
GLOBULIN, TOTAL: 2.4 g/dL (ref 1.5–4.5)
Glucose: 97 mg/dL (ref 65–99)
POTASSIUM: 4.8 mmol/L (ref 3.5–5.2)
Sodium: 141 mmol/L (ref 134–144)
TOTAL PROTEIN: 6.7 g/dL (ref 6.0–8.5)

## 2017-05-24 LAB — CBC WITH DIFFERENTIAL/PLATELET
BASOS: 0 %
Basophils Absolute: 0 10*3/uL (ref 0.0–0.2)
EOS (ABSOLUTE): 0.3 10*3/uL (ref 0.0–0.4)
EOS: 3 %
HEMATOCRIT: 50 % (ref 37.5–51.0)
Hemoglobin: 16.9 g/dL (ref 13.0–17.7)
IMMATURE GRANS (ABS): 0.1 10*3/uL (ref 0.0–0.1)
IMMATURE GRANULOCYTES: 1 %
LYMPHS ABS: 2.1 10*3/uL (ref 0.7–3.1)
Lymphs: 23 %
MCH: 31.8 pg (ref 26.6–33.0)
MCHC: 33.8 g/dL (ref 31.5–35.7)
MCV: 94 fL (ref 79–97)
MONOCYTES: 7 %
Monocytes Absolute: 0.6 10*3/uL (ref 0.1–0.9)
NEUTROS ABS: 6.1 10*3/uL (ref 1.4–7.0)
Neutrophils: 66 %
PLATELETS: 215 10*3/uL (ref 150–379)
RBC: 5.31 x10E6/uL (ref 4.14–5.80)
RDW: 14 % (ref 12.3–15.4)
WBC: 9.2 10*3/uL (ref 3.4–10.8)

## 2017-05-24 LAB — LIPID PANEL W/O CHOL/HDL RATIO
Cholesterol, Total: 186 mg/dL (ref 100–199)
HDL: 43 mg/dL (ref >39)
LDL CALC: 113 mg/dL — AB (ref 0–99)
Triglycerides: 149 mg/dL (ref 0–149)
VLDL CHOLESTEROL CAL: 30 mg/dL (ref 5–40)

## 2017-11-30 IMAGING — CR DG SHOULDER 2+V*R*
4 series · 4 of 4 positions shown · non-contrast
Comparison: None.

CLINICAL DATA: Right shoulder pain

EXAM:
RIGHT SHOULDER - 2+ VIEW

[shoulder grashey (1 of 2)]
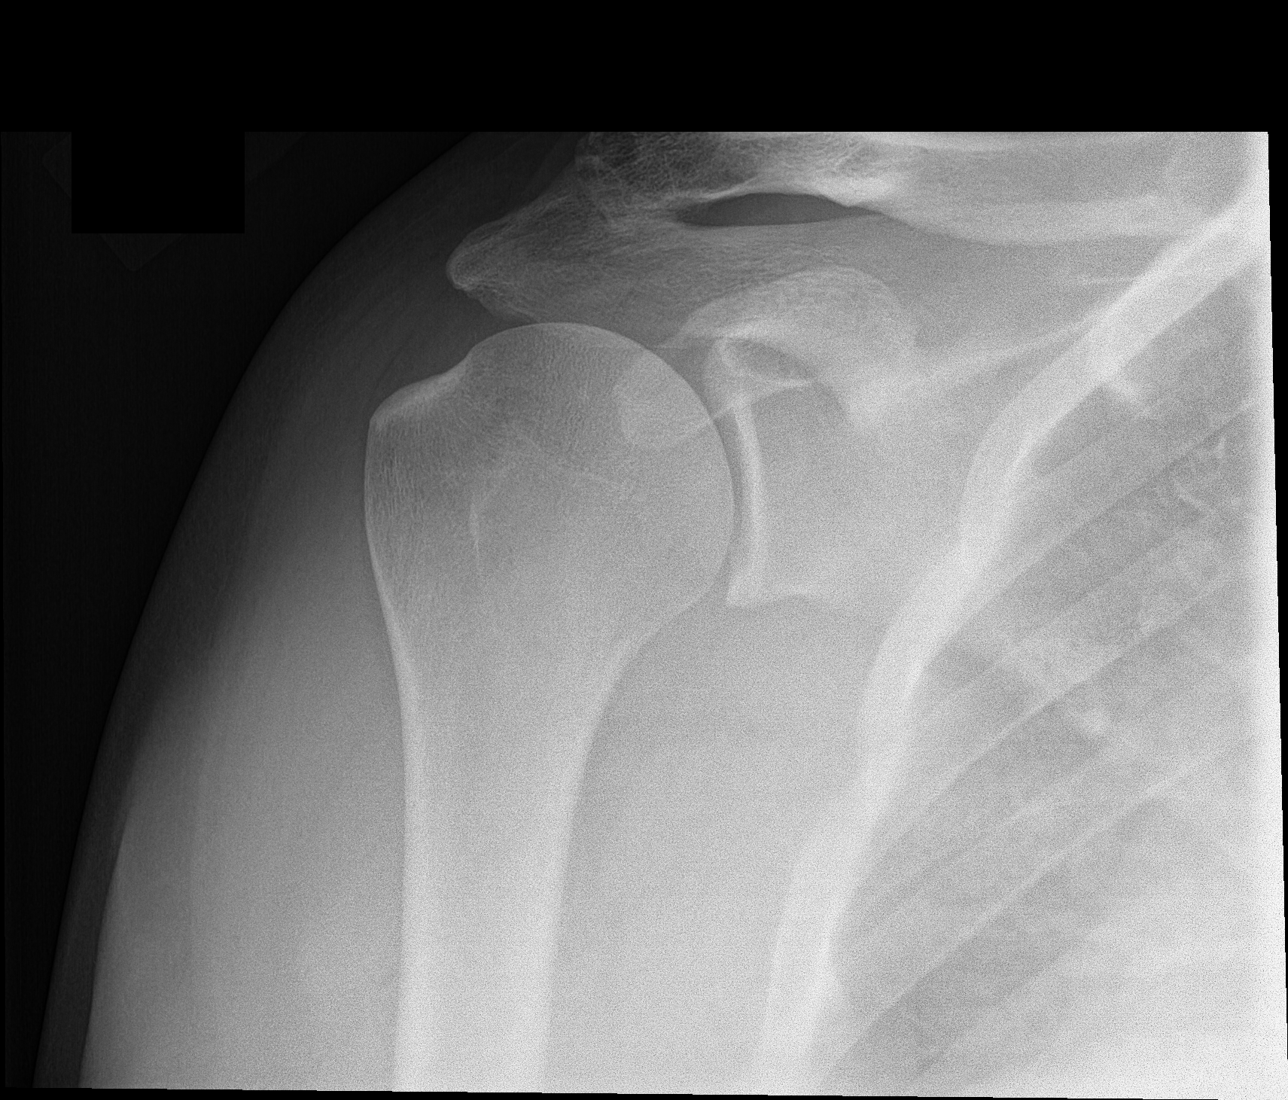

[shoulder axial]
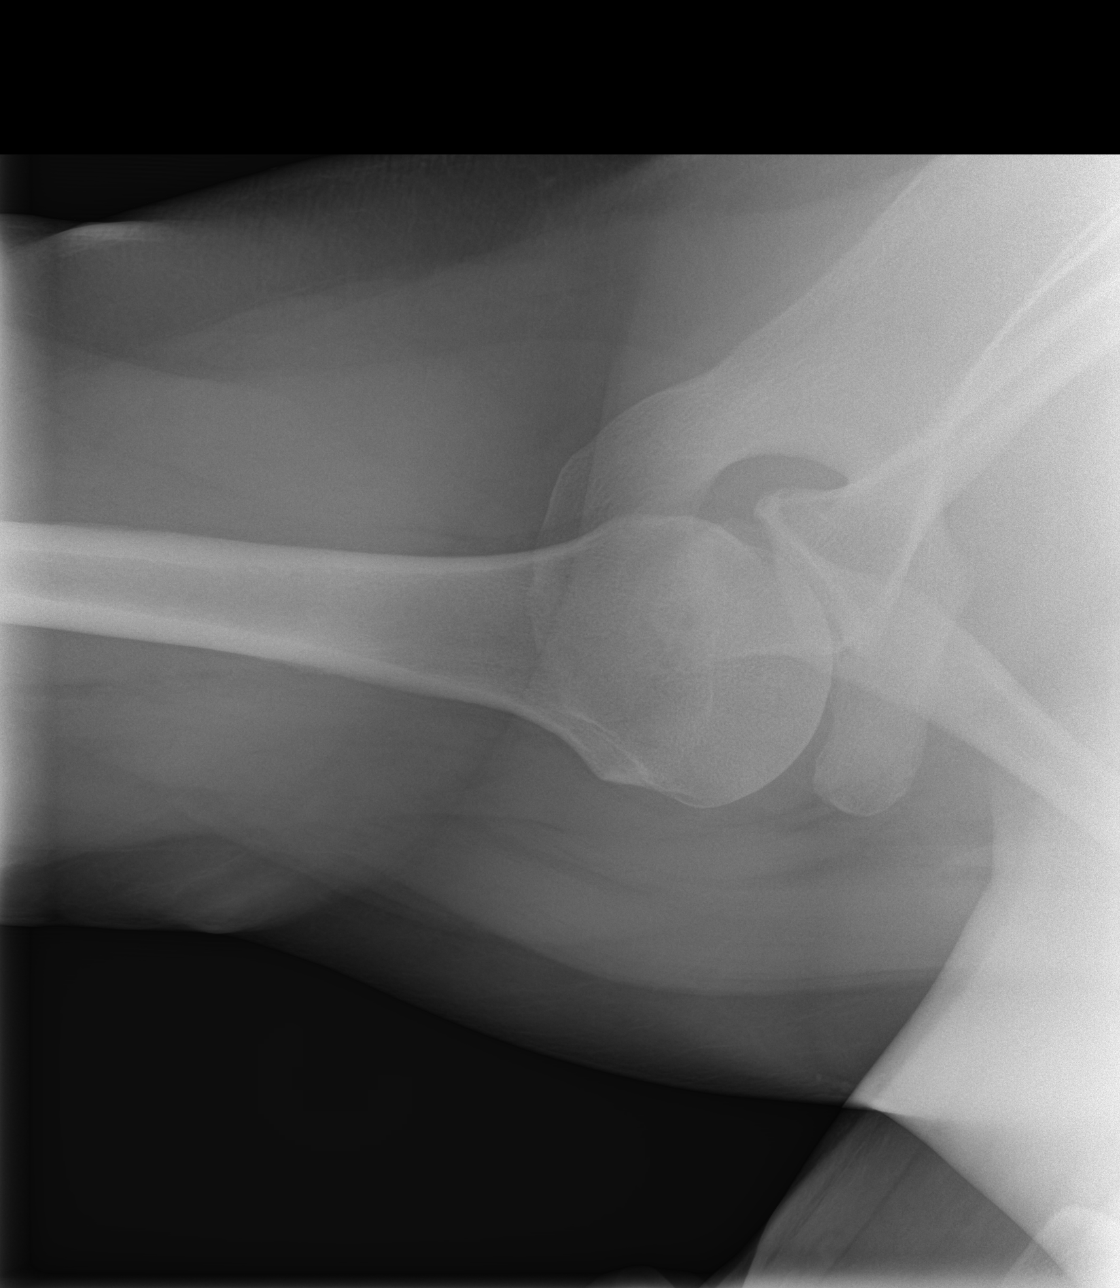

[shoulder grashey (2 of 2)]
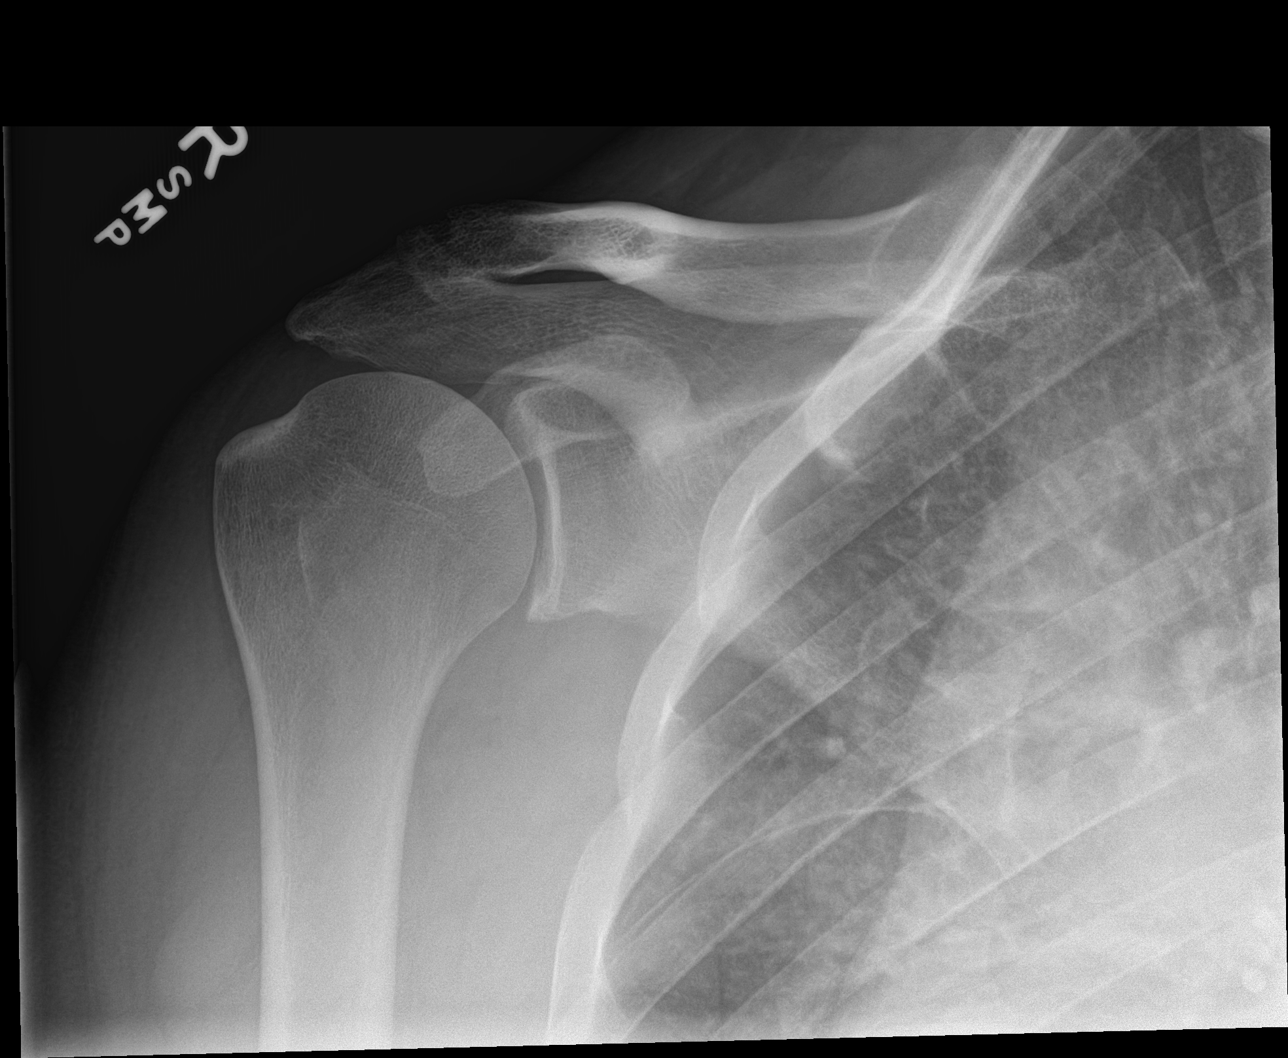

[shoulder y view]
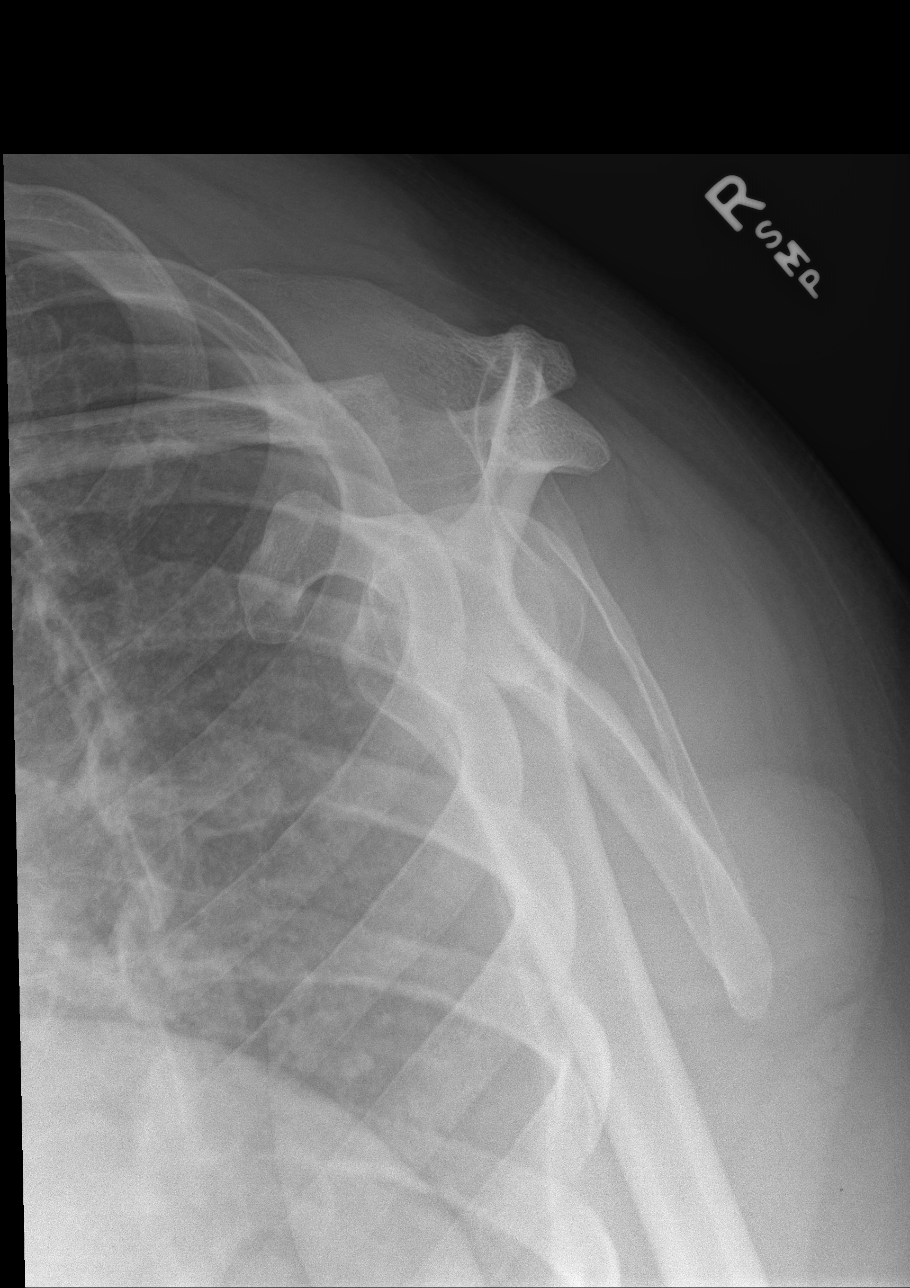

[4 of 4 positions shown; findings below may reference images not displayed]

FINDINGS: No fracture or dislocation is seen.

The joint spaces are preserved.

The visualized soft tissues are unremarkable.

Visualized right lung is clear.
IMPRESSION: Negative.

## 2019-04-12 HISTORY — PX: VARICOSE VEIN SURGERY: SHX832

## 2019-04-23 ENCOUNTER — Other Ambulatory Visit: Payer: Self-pay

## 2019-04-23 ENCOUNTER — Ambulatory Visit: Payer: Managed Care, Other (non HMO) | Admitting: Family Medicine

## 2019-04-23 ENCOUNTER — Encounter: Payer: Self-pay | Admitting: Family Medicine

## 2019-04-23 VITALS — BP 132/90 | HR 64 | Temp 98.9°F | Wt 299.6 lb

## 2019-04-23 DIAGNOSIS — R030 Elevated blood-pressure reading, without diagnosis of hypertension: Secondary | ICD-10-CM

## 2019-04-23 NOTE — Progress Notes (Signed)
BP 132/90   Pulse 64   Temp 98.9 F (37.2 C) (Oral)   Wt 299 lb 9.6 oz (135.9 kg)   SpO2 98%   BMI 36.47 kg/m    Subjective:    Patient ID: [name], male    DOB: 01-05-1976, 43 y.o.   MRN: [id number]  HPI: [name] is a 43 y.o. male  Chief Complaint  Patient presents with  . Hypertension    pt states he has been getting elevated BP readings lately   Patient here today concerned about some recent BP changes. Having vascular procedures recently, and BPs during these visits have been 160s/110 the past few months. Bought a home monitor and pressures have been 140s/90-100s when checked, but notes he's able to bring them down some with deep breathing. Diet is a bit worse than previous at the moment but otherwise no major lifestyle changes lately. Denies CP, SOB, HAs, dizziness.   Relevant past medical, surgical, family and social history reviewed and updated as indicated. Interim medical history since our last visit reviewed. Allergies and medications reviewed and updated.  Review of Systems  Per HPI unless specifically indicated above     Objective:    BP 132/90   Pulse 64   Temp 98.9 F (37.2 C) (Oral)   Wt 299 lb 9.6 oz (135.9 kg)   SpO2 98%   BMI 36.47 kg/m   Wt Readings from Last 3 Encounters:  04/23/19 299 lb 9.6 oz (135.9 kg)  05/23/17 (!) 355 lb 3.2 oz (161.1 kg)  05/12/17 (!) 355 lb (161 kg)    Physical Exam Vitals signs and nursing note reviewed.  Constitutional:      Appearance: Normal appearance.  HENT:     Head: Atraumatic.  Eyes:     Extraocular Movements: Extraocular movements intact.     Conjunctiva/sclera: Conjunctivae normal.  Neck:     Musculoskeletal: Normal range of motion and neck supple.  Cardiovascular:     Rate and Rhythm: Normal rate and regular rhythm.  Pulmonary:     Effort: Pulmonary effort is normal.     Breath sounds: Normal breath sounds.  Musculoskeletal: Normal range of motion.  Skin:    General: Skin is  warm and dry.  Neurological:     General: No focal deficit present.     Mental Status: He is oriented to person, place, and time.  Psychiatric:        Mood and Affect: Mood normal.        Thought Content: Thought content normal.        Judgment: Judgment normal.     Results for orders placed or performed in visit on 05/23/17  Microscopic Examination   URINE  Result Value Ref Range   WBC, UA 0-5 0 - 5 /hpf   RBC, UA 0-2 0 - 2 /hpf   Epithelial Cells (non renal) 0-10 0 - 10 /hpf   Bacteria, UA Few None seen/Few  CBC with Differential/Platelet  Result Value Ref Range   WBC 9.2 3.4 - 10.8 x10E3/uL   RBC 5.31 4.14 - 5.80 x10E6/uL   Hemoglobin 16.9 13.0 - 17.7 g/dL   Hematocrit 33.2 95.1 - 51.0 %   MCV 94 79 - 97 fL   MCH 31.8 26.6 - 33.0 pg   MCHC 33.8 31.5 - 35.7 g/dL   RDW 88.4 16.6 - 06.3 %   Platelets 215 150 - 379 x10E3/uL   Neutrophils 66 Not Estab. %   Lymphs  23 Not Estab. %   Monocytes 7 Not Estab. %   Eos 3 Not Estab. %   Basos 0 Not Estab. %   Neutrophils Absolute 6.1 1.4 - 7.0 x10E3/uL   Lymphocytes Absolute 2.1 0.7 - 3.1 x10E3/uL   Monocytes Absolute 0.6 0.1 - 0.9 x10E3/uL   EOS (ABSOLUTE) 0.3 0.0 - 0.4 x10E3/uL   Basophils Absolute 0.0 0.0 - 0.2 x10E3/uL   Immature Granulocytes 1 Not Estab. %   Immature Grans (Abs) 0.1 0.0 - 0.1 x10E3/uL  Comprehensive metabolic panel  Result Value Ref Range   Glucose 97 65 - 99 mg/dL   BUN 12 6 - 24 mg/dL   Creatinine, Ser 0.99 0.76 - 1.27 mg/dL   GFR calc non Af Amer 93 >59 mL/min/1.73   GFR calc Af Amer 107 >59 mL/min/1.73   BUN/Creatinine Ratio 12 9 - 20   Sodium 141 134 - 144 mmol/L   Potassium 4.8 3.5 - 5.2 mmol/L   Chloride 104 96 - 106 mmol/L   CO2 23 20 - 29 mmol/L   Calcium 9.0 8.7 - 10.2 mg/dL   Total Protein 6.7 6.0 - 8.5 g/dL   Albumin 4.3 3.5 - 5.5 g/dL   Globulin, Total 2.4 1.5 - 4.5 g/dL   Albumin/Globulin Ratio 1.8 1.2 - 2.2   Bilirubin Total 0.4 0.0 - 1.2 mg/dL   Alkaline Phosphatase 81 39 - 117  IU/L   AST 22 0 - 40 IU/L   ALT 41 0 - 44 IU/L  Lipid Panel w/o Chol/HDL Ratio  Result Value Ref Range   Cholesterol, Total 186 100 - 199 mg/dL   Triglycerides 149 0 - 149 mg/dL   HDL 43 >39 mg/dL   VLDL Cholesterol Cal 30 5 - 40 mg/dL   LDL Calculated 113 (H) 0 - 99 mg/dL  UA/M w/rflx Culture, Routine   Specimen: Urine   URINE  Result Value Ref Range   Specific Gravity, UA 1.020 1.005 - 1.030   pH, UA 6.5 5.0 - 7.5   Color, UA Orange Yellow   Appearance Ur Clear Clear   Leukocytes, UA Negative Negative   Protein, UA Trace (A) Negative/Trace   Glucose, UA Negative Negative   Ketones, UA Trace (A) Negative   RBC, UA Trace (A) Negative   Bilirubin, UA Negative Negative   Urobilinogen, Ur 0.2 0.2 - 1.0 mg/dL   Nitrite, UA Negative Negative   Microscopic Examination See below:       Assessment & Plan:   Problem List Items Addressed This Visit    None    Visit Diagnoses    Elevated blood pressure reading    -  Primary    Checked patient's home monitor against a manual reading, got almost 10 points lower systolic and about 5 points lower diastolic with manual reading. Both manual readings and machine reading done today in office WNL. Concerned to add any medications for fear of dropping pressures too low. Work on [employer], exercise, stress reduction and continue home monitoring with 5-10 point differences in mind. Will recheck at upcoming CPE and decide from there if medication is warranted   Follow up plan: Return in about 2 weeks (around 05/07/2019) for CPE.

## 2019-05-14 ENCOUNTER — Ambulatory Visit (INDEPENDENT_AMBULATORY_CARE_PROVIDER_SITE_OTHER): Payer: Managed Care, Other (non HMO) | Admitting: Family Medicine

## 2019-05-14 ENCOUNTER — Encounter: Payer: Self-pay | Admitting: Family Medicine

## 2019-05-14 ENCOUNTER — Other Ambulatory Visit: Payer: Self-pay

## 2019-05-14 VITALS — BP 148/98 | HR 98 | Temp 98.8°F | Ht 73.7 in | Wt 294.6 lb

## 2019-05-14 DIAGNOSIS — Z0000 Encounter for general adult medical examination without abnormal findings: Secondary | ICD-10-CM

## 2019-05-14 DIAGNOSIS — I10 Essential (primary) hypertension: Secondary | ICD-10-CM | POA: Insufficient documentation

## 2019-05-14 LAB — UA/M W/RFLX CULTURE, ROUTINE
Bilirubin, UA: NEGATIVE
Glucose, UA: NEGATIVE
Leukocytes,UA: NEGATIVE
Nitrite, UA: NEGATIVE
Protein,UA: NEGATIVE
Specific Gravity, UA: 1.025 (ref 1.005–1.030)
Urobilinogen, Ur: 0.2 mg/dL (ref 0.2–1.0)
pH, UA: 5 (ref 5.0–7.5)

## 2019-05-14 LAB — MICROSCOPIC EXAMINATION: Bacteria, UA: NONE SEEN

## 2019-05-14 MED ORDER — AMLODIPINE BESYLATE 5 MG PO TABS: 5.0000 mg | ORAL_TABLET | Freq: Every day | ORAL | 0 refills | Status: DC

## 2019-05-14 NOTE — Progress Notes (Signed)
BP (!) 148/98   Pulse 98   Temp 98.8 F (37.1 C) (Oral)   Ht 6' 1.7" (1.872 m)   Wt 294 lb 9.6 oz (133.6 kg)   SpO2 91%   BMI 38.13 kg/m    Subjective:    Patient ID: [name], male    DOB: 10-03-1975, 43 y.o.   MRN: [id number]  HPI: [name] is a 43 y.o. male presenting on 05/14/2019 for comprehensive medical examination. Current medical complaints include:see below  Notes his BPs have been elevated when they're checked. Has never been on medication for this that he can recall. Denies HAs, dizziness, CP, SOB, syncope. Does not watch diet or exercise regularly.   He currently lives with: Interim Problems from his last visit: no  Home BPs 140s/90s.   Depression Screen done today and results listed below:  Depression screen [facility] 2/9 05/14/2019 04/23/2019 05/12/2017  Decreased Interest 0 0 0  Down, Depressed, Hopeless 0 0 0  PHQ - 2 Score 0 0 0    The patient does not have a history of falls. I did complete a risk assessment for falls. A plan of care for falls was documented.   Past Medical History:  Past Medical History:  Diagnosis Date  . Obesity     Surgical History:  Past Surgical History:  Procedure Laterality Date  . APPENDECTOMY    . BACK SURGERY    . HERNIA REPAIR    . TONSILLECTOMY    . TUMOR REMOVAL  2001   benign mass removed from behind left ear  . VARICOSE VEIN SURGERY Bilateral 04/12/2019    Medications:  Current Outpatient Medications on File Prior to Visit  Medication Sig  . ibuprofen (ADVIL,MOTRIN) 200 MG tablet Take 200 mg every 6 (six) hours as needed by mouth.   No current facility-administered medications on file prior to visit.     Allergies:  Allergies  Allergen Reactions  . Erythromycin Anaphylaxis  . Penicillins Anaphylaxis  . Sulfa Antibiotics Anaphylaxis    Social History:  Social History   Socioeconomic History  . Marital status: Single    Spouse name: Not on file  . Number of children: Not on file  .  Years of education: Not on file  . Highest education level: Not on file  Occupational History  . Not on file  Social Needs  . Financial resource strain: Not on file  . Food insecurity    Worry: Not on file    Inability: Not on file  . Transportation needs    Medical: Not on file    Non-medical: Not on file  Tobacco Use  . Smoking status: Current Every Day Smoker    Packs/day: 1.00    Types: Cigarettes  . Smokeless tobacco: Never Used  Substance and Sexual Activity  . Alcohol use: Yes    Alcohol/week: 5.0 - 6.0 standard drinks    Types: 5 - 6 Shots of liquor per week  . Drug use: No  . Sexual activity: Not on file  Lifestyle  . Physical activity    Days per week: Not on file    Minutes per session: Not on file  . Stress: Not on file  Relationships  . Social Herbalist on phone: Not on file    Gets together: Not on file    Attends religious service: Not on file    Active member of club or organization: Not on file    Attends meetings  of clubs or organizations: Not on file    Relationship status: Not on file  . Intimate partner violence    Fear of current or ex partner: Not on file    Emotionally abused: Not on file    Physically abused: Not on file    Forced sexual activity: Not on file  Other Topics Concern  . Not on file  Social History Narrative  . Not on file   Social History   Tobacco Use  Smoking Status Current Every Day Smoker  . Packs/day: 1.00  . Types: Cigarettes  Smokeless Tobacco Never Used   Social History   Substance and Sexual Activity  Alcohol Use Yes  . Alcohol/week: 5.0 - 6.0 standard drinks  . Types: 5 - 6 Shots of liquor per week    Family History:  Family History  Problem Relation Age of Onset  . Heart disease Paternal Grandfather   . COPD Neg Hx   . Diabetes Neg Hx   . Hypertension Neg Hx   . Stroke Neg Hx     Past medical history, surgical history, medications, allergies, family history and social history reviewed  with patient today and changes made to appropriate areas of the chart.   Review of Systems - General ROS: negative Psychological ROS: negative Ophthalmic ROS: negative ENT ROS: negative Allergy and Immunology ROS: negative Hematological and Lymphatic ROS: negative Endocrine ROS: negative Respiratory ROS: no cough, shortness of breath, or wheezing Cardiovascular ROS: no chest pain or dyspnea on exertion Gastrointestinal ROS: no abdominal pain, change in bowel habits, or black or bloody stools Genito-Urinary ROS: no dysuria, trouble voiding, or hematuria Musculoskeletal ROS: negative Neurological ROS: no TIA or stroke symptoms Dermatological ROS: negative All other ROS negative except what is listed above and in the HPI.      Objective:    BP (!) 148/98   Pulse 98   Temp 98.8 F (37.1 C) (Oral)   Ht 6' 1.7" (1.872 m)   Wt 294 lb 9.6 oz (133.6 kg)   SpO2 91%   BMI 38.13 kg/m   Wt Readings from Last 3 Encounters:  05/14/19 294 lb 9.6 oz (133.6 kg)  04/23/19 299 lb 9.6 oz (135.9 kg)  05/23/17 (!) 355 lb 3.2 oz (161.1 kg)    Physical Exam Vitals signs and nursing note reviewed.  Constitutional:      General: He is not in acute distress.    Appearance: He is well-developed.  HENT:     Head: Atraumatic.     Right Ear: Tympanic membrane and external ear normal.     Left Ear: Tympanic membrane and external ear normal.     Nose: Nose normal.     Mouth/Throat:     Mouth: Mucous membranes are moist.     Pharynx: Oropharynx is clear.  Eyes:     General: No scleral icterus.    Conjunctiva/sclera: Conjunctivae normal.     Pupils: Pupils are equal, round, and reactive to light.  Neck:     Musculoskeletal: Normal range of motion and neck supple.  Cardiovascular:     Rate and Rhythm: Normal rate and regular rhythm.     Heart sounds: Normal heart sounds. No murmur.  Pulmonary:     Effort: Pulmonary effort is normal. No respiratory distress.     Breath sounds: Normal breath  sounds.  Abdominal:     General: Bowel sounds are normal. There is no distension.     Palpations: Abdomen is soft.  Tenderness: There is no abdominal tenderness. There is no guarding.     Hernia: A hernia (umbilical hernia, reducible) is present.  Genitourinary:    Comments: GU exam declined Musculoskeletal: Normal range of motion.        General: No tenderness.  Skin:    General: Skin is warm and dry.     Findings: No rash.  Neurological:     General: No focal deficit present.     Mental Status: He is alert and oriented to person, place, and time.     Deep Tendon Reflexes: Reflexes are normal and symmetric.  Psychiatric:        Mood and Affect: Mood normal.        Behavior: Behavior normal.        Thought Content: Thought content normal.        Judgment: Judgment normal.     Results for orders placed or performed in visit on 05/14/19  Microscopic Examination   URINE  Result Value Ref Range   WBC, UA 0-5 0 - 5 /hpf   RBC 0-2 0 - 2 /hpf   Epithelial Cells (non renal) 0-10 0 - 10 /hpf   Bacteria, UA None seen None seen/Few  CBC with Differential/Platelet  Result Value Ref Range   WBC 6.2 3.4 - 10.8 x10E3/uL   RBC 5.10 4.14 - 5.80 x10E6/uL   Hemoglobin 17.3 13.0 - 17.7 g/dL   Hematocrit 54.0 08.6 - 51.0 %   MCV 97 79 - 97 fL   MCH 33.9 (H) 26.6 - 33.0 pg   MCHC 34.9 31.5 - 35.7 g/dL   RDW 76.1 95.0 - 93.2 %   Platelets 210 150 - 450 x10E3/uL   Neutrophils 64 Not Estab. %   Lymphs 21 Not Estab. %   Monocytes 9 Not Estab. %   Eos 4 Not Estab. %   Basos 1 Not Estab. %   Neutrophils Absolute 4.0 1.4 - 7.0 x10E3/uL   Lymphocytes Absolute 1.3 0.7 - 3.1 x10E3/uL   Monocytes Absolute 0.5 0.1 - 0.9 x10E3/uL   EOS (ABSOLUTE) 0.3 0.0 - 0.4 x10E3/uL   Basophils Absolute 0.1 0.0 - 0.2 x10E3/uL   Immature Granulocytes 1 Not Estab. %   Immature Grans (Abs) 0.0 0.0 - 0.1 x10E3/uL  Comprehensive metabolic panel  Result Value Ref Range   Glucose 97 65 - 99 mg/dL   BUN 11 6 -  24 mg/dL   Creatinine, Ser 6.71 0.76 - 1.27 mg/dL   GFR calc non Af Amer 105 >59 mL/min/1.73   GFR calc Af Amer 121 >59 mL/min/1.73   BUN/Creatinine Ratio 13 9 - 20   Sodium 140 134 - 144 mmol/L   Potassium 4.4 3.5 - 5.2 mmol/L   Chloride 104 96 - 106 mmol/L   CO2 20 20 - 29 mmol/L   Calcium 8.8 8.7 - 10.2 mg/dL   Total Protein 6.5 6.0 - 8.5 g/dL   Albumin 4.1 4.0 - 5.0 g/dL   Globulin, Total 2.4 1.5 - 4.5 g/dL   Albumin/Globulin Ratio 1.7 1.2 - 2.2   Bilirubin Total 0.4 0.0 - 1.2 mg/dL   Alkaline Phosphatase 91 39 - 117 IU/L   AST 28 0 - 40 IU/L   ALT 46 (H) 0 - 44 IU/L  Lipid Panel w/o Chol/HDL Ratio  Result Value Ref Range   Cholesterol, Total 174 100 - 199 mg/dL   Triglycerides 93 0 - 149 mg/dL   HDL 54 >24 mg/dL   VLDL Cholesterol Cal  17 5 - 40 mg/dL   LDL Chol Calc (NIH) 409103 (H) 0 - 99 mg/dL  TSH  Result Value Ref Range   TSH 2.570 0.450 - 4.500 uIU/mL  UA/M w/rflx Culture, Routine   Specimen: Urine   URINE  Result Value Ref Range   Specific Gravity, UA 1.025 1.005 - 1.030   pH, UA 5.0 5.0 - 7.5   Color, UA Yellow Yellow   Appearance Ur Clear Clear   Leukocytes,UA Negative Negative   Protein,UA Negative Negative/Trace   Glucose, UA Negative Negative   Ketones, UA Trace (A) Negative   RBC, UA 1+ (A) Negative   Bilirubin, UA Negative Negative   Urobilinogen, Ur 0.2 0.2 - 1.0 mg/dL   Nitrite, UA Negative Negative   Microscopic Examination See below:       Assessment & Plan:   Problem List Items Addressed This Visit      Cardiovascular and Mediastinum   Essential hypertension    Persistently mildly elevated, discussed DASH diet, exercise, weight loss, and adding low dose amlodipine. Script given for home BP monitor, recommended logging home readings and bringing to appt. Call with persistent abnormal readings at home      Relevant Medications   amLODipine (NORVASC) 5 MG tablet    Other Visit Diagnoses    Annual physical exam    -  Primary   Relevant  Orders   CBC with Differential/Platelet (Completed)   Comprehensive metabolic panel (Completed)   Lipid Panel w/o Chol/HDL Ratio (Completed)   TSH (Completed)   UA/M w/rflx Culture, Routine (Completed)       Discussed aspirin prophylaxis for myocardial infarction prevention and decision was it was not indicated  LABORATORY TESTING:  Health maintenance labs ordered today as discussed above.   The natural history of prostate cancer and ongoing controversy regarding screening and potential treatment outcomes of prostate cancer has been discussed with the patient. The meaning of a false positive PSA and a false negative PSA has been discussed. He indicates understanding of the limitations of this screening test and wishes not to proceed with screening PSA testing.   IMMUNIZATIONS:   - Tdap: Tetanus vaccination status reviewed: last tetanus booster within 10 years. - Influenza: Refused  PATIENT COUNSELING:    Sexuality: Discussed sexually transmitted diseases, partner selection, use of condoms, avoidance of unintended pregnancy  and contraceptive alternatives.   Advised to avoid cigarette smoking.  I discussed with the patient that most people either abstain from alcohol or drink within safe limits (<=14/week and <=4 drinks/occasion for males, <=7/weeks and <= 3 drinks/occasion for females) and that the risk for alcohol disorders and other health effects rises proportionally with the number of drinks per week and how often a drinker exceeds daily limits.  Discussed cessation/primary prevention of drug use and availability of treatment for abuse.   Diet: Encouraged to adjust caloric intake to maintain  or achieve ideal body weight, to reduce intake of dietary saturated fat and total fat, to limit sodium intake by avoiding high sodium foods and not adding table salt, and to maintain adequate dietary potassium and calcium preferably from fresh fruits, vegetables, and low-fat dairy products.     stressed the importance of regular exercise  Injury prevention: Discussed safety belts, safety helmets, smoke detector, smoking near bedding or upholstery.   Dental health: Discussed importance of regular tooth brushing, flossing, and dental visits.   Follow up plan: NEXT PREVENTATIVE PHYSICAL DUE IN 1 YEAR. Return in about 4 weeks (around 06/11/2019)  for BP f/u.

## 2019-05-15 LAB — COMPREHENSIVE METABOLIC PANEL
ALT: 46 IU/L — ABNORMAL HIGH (ref 0–44)
AST: 28 IU/L (ref 0–40)
Albumin/Globulin Ratio: 1.7 (ref 1.2–2.2)
Albumin: 4.1 g/dL (ref 4.0–5.0)
Alkaline Phosphatase: 91 IU/L (ref 39–117)
BUN/Creatinine Ratio: 13 (ref 9–20)
BUN: 11 mg/dL (ref 6–24)
Bilirubin Total: 0.4 mg/dL (ref 0.0–1.2)
CO2: 20 mmol/L (ref 20–29)
Calcium: 8.8 mg/dL (ref 8.7–10.2)
Chloride: 104 mmol/L (ref 96–106)
Creatinine, Ser: 0.86 mg/dL (ref 0.76–1.27)
GFR calc Af Amer: 121 mL/min/{1.73_m2} (ref >59)
GFR calc non Af Amer: 105 mL/min/{1.73_m2} (ref >59)
Globulin, Total: 2.4 g/dL (ref 1.5–4.5)
Glucose: 97 mg/dL (ref 65–99)
Potassium: 4.4 mmol/L (ref 3.5–5.2)
Sodium: 140 mmol/L (ref 134–144)
Total Protein: 6.5 g/dL (ref 6.0–8.5)

## 2019-05-15 LAB — CBC WITH DIFFERENTIAL/PLATELET
Basophils Absolute: 0.1 10*3/uL (ref 0.0–0.2)
Basos: 1 %
EOS (ABSOLUTE): 0.3 10*3/uL (ref 0.0–0.4)
Eos: 4 %
Hematocrit: 49.6 % (ref 37.5–51.0)
Hemoglobin: 17.3 g/dL (ref 13.0–17.7)
Immature Grans (Abs): 0 10*3/uL (ref 0.0–0.1)
Immature Granulocytes: 1 %
Lymphocytes Absolute: 1.3 10*3/uL (ref 0.7–3.1)
Lymphs: 21 %
MCH: 33.9 pg — ABNORMAL HIGH (ref 26.6–33.0)
MCHC: 34.9 g/dL (ref 31.5–35.7)
MCV: 97 fL (ref 79–97)
Monocytes Absolute: 0.5 10*3/uL (ref 0.1–0.9)
Monocytes: 9 %
Neutrophils Absolute: 4 10*3/uL (ref 1.4–7.0)
Neutrophils: 64 %
Platelets: 210 10*3/uL (ref 150–450)
RBC: 5.1 x10E6/uL (ref 4.14–5.80)
RDW: 12.9 % (ref 11.6–15.4)
WBC: 6.2 10*3/uL (ref 3.4–10.8)

## 2019-05-15 LAB — LIPID PANEL W/O CHOL/HDL RATIO
Cholesterol, Total: 174 mg/dL (ref 100–199)
HDL: 54 mg/dL (ref >39)
LDL Chol Calc (NIH): 103 mg/dL — ABNORMAL HIGH (ref 0–99)
Triglycerides: 93 mg/dL (ref 0–149)
VLDL Cholesterol Cal: 17 mg/dL (ref 5–40)

## 2019-05-15 LAB — TSH: TSH: 2.57 u[IU]/mL (ref 0.450–4.500)

## 2019-05-17 NOTE — Assessment & Plan Note (Signed)
Persistently mildly elevated, discussed DASH diet, exercise, weight loss, and adding low dose amlodipine. Script given for home BP monitor, recommended logging home readings and bringing to appt. Call with persistent abnormal readings at home

## 2019-06-08 ENCOUNTER — Other Ambulatory Visit: Payer: Self-pay | Admitting: Family Medicine

## 2019-06-10 ENCOUNTER — Other Ambulatory Visit: Payer: Self-pay | Admitting: Family Medicine

## 2019-06-12 ENCOUNTER — Encounter: Payer: Self-pay | Admitting: Family Medicine

## 2019-06-18 ENCOUNTER — Ambulatory Visit (INDEPENDENT_AMBULATORY_CARE_PROVIDER_SITE_OTHER): Payer: Managed Care, Other (non HMO) | Admitting: Family Medicine

## 2019-06-18 ENCOUNTER — Other Ambulatory Visit: Payer: Self-pay

## 2019-06-18 ENCOUNTER — Encounter: Payer: Self-pay | Admitting: Family Medicine

## 2019-06-18 VITALS — BP 127/84 | HR 79 | Temp 98.2°F | Ht 75.0 in | Wt 290.0 lb

## 2019-06-18 DIAGNOSIS — I10 Essential (primary) hypertension: Secondary | ICD-10-CM | POA: Diagnosis not present

## 2019-06-18 MED ORDER — AMLODIPINE BESYLATE 5 MG PO TABS: 5.0000 mg | ORAL_TABLET | Freq: Every day | ORAL | 1 refills | Status: DC

## 2019-06-18 NOTE — Assessment & Plan Note (Signed)
BPs stable and WNL, continue current regimen 

## 2019-06-18 NOTE — Progress Notes (Signed)
BP 127/84   Pulse 79   Temp 98.2 F (36.8 C) (Oral)   Ht 6\' 3"  (1.905 m)   Wt 290 lb (131.5 kg)   SpO2 99%   BMI 36.25 kg/m    Subjective:    Patient ID: [name], male    DOB: 1975-09-19, 43 y.o.   MRN: [id number]  HPI: [name] is a 43 y.o. male  Chief Complaint  Patient presents with  . Hypertension   Patient presenting today for 1 month HTN f/u after starting amlodipine. Tolerating well without side effects. Denies CP, SOB, HAs, dizziness. Home readings running 120-130/80s on average. Has not been formally exercising but trying to reduce sodium intake.   Relevant past medical, surgical, family and social history reviewed and updated as indicated. Interim medical history since our last visit reviewed. Allergies and medications reviewed and updated.  Review of Systems  Per HPI unless specifically indicated above     Objective:    BP 127/84   Pulse 79   Temp 98.2 F (36.8 C) (Oral)   Ht 6\' 3"  (1.905 m)   Wt 290 lb (131.5 kg)   SpO2 99%   BMI 36.25 kg/m   Wt Readings from Last 3 Encounters:  06/18/19 290 lb (131.5 kg)  05/14/19 294 lb 9.6 oz (133.6 kg)  04/23/19 299 lb 9.6 oz (135.9 kg)    Physical Exam Vitals signs and nursing note reviewed.  Constitutional:      Appearance: Normal appearance.  HENT:     Head: Atraumatic.  Eyes:     Extraocular Movements: Extraocular movements intact.     Conjunctiva/sclera: Conjunctivae normal.  Neck:     Musculoskeletal: Normal range of motion and neck supple.  Cardiovascular:     Rate and Rhythm: Normal rate and regular rhythm.  Pulmonary:     Effort: Pulmonary effort is normal.     Breath sounds: Normal breath sounds.  Musculoskeletal: Normal range of motion.  Skin:    General: Skin is warm and dry.  Neurological:     General: No focal deficit present.     Mental Status: He is oriented to person, place, and time.  Psychiatric:        Mood and Affect: Mood normal.        Thought Content:  Thought content normal.        Judgment: Judgment normal.     Results for orders placed or performed in visit on 05/14/19  Microscopic Examination   URINE  Result Value Ref Range   WBC, UA 0-5 0 - 5 /hpf   RBC 0-2 0 - 2 /hpf   Epithelial Cells (non renal) 0-10 0 - 10 /hpf   Bacteria, UA None seen None seen/Few  CBC with Differential/Platelet  Result Value Ref Range   WBC 6.2 3.4 - 10.8 x10E3/uL   RBC 5.10 4.14 - 5.80 x10E6/uL   Hemoglobin 17.3 13.0 - 17.7 g/dL   Hematocrit 49.6 37.5 - 51.0 %   MCV 97 79 - 97 fL   MCH 33.9 (H) 26.6 - 33.0 pg   MCHC 34.9 31.5 - 35.7 g/dL   RDW 12.9 11.6 - 15.4 %   Platelets 210 150 - 450 x10E3/uL   Neutrophils 64 Not Estab. %   Lymphs 21 Not Estab. %   Monocytes 9 Not Estab. %   Eos 4 Not Estab. %   Basos 1 Not Estab. %   Neutrophils Absolute 4.0 1.4 - 7.0 x10E3/uL  Lymphocytes Absolute 1.3 0.7 - 3.1 x10E3/uL   Monocytes Absolute 0.5 0.1 - 0.9 x10E3/uL   EOS (ABSOLUTE) 0.3 0.0 - 0.4 x10E3/uL   Basophils Absolute 0.1 0.0 - 0.2 x10E3/uL   Immature Granulocytes 1 Not Estab. %   Immature Grans (Abs) 0.0 0.0 - 0.1 x10E3/uL  Comprehensive metabolic panel  Result Value Ref Range   Glucose 97 65 - 99 mg/dL   BUN 11 6 - 24 mg/dL   Creatinine, Ser 8.67 0.76 - 1.27 mg/dL   GFR calc non Af Amer 105 >59 mL/min/1.73   GFR calc Af Amer 121 >59 mL/min/1.73   BUN/Creatinine Ratio 13 9 - 20   Sodium 140 134 - 144 mmol/L   Potassium 4.4 3.5 - 5.2 mmol/L   Chloride 104 96 - 106 mmol/L   CO2 20 20 - 29 mmol/L   Calcium 8.8 8.7 - 10.2 mg/dL   Total Protein 6.5 6.0 - 8.5 g/dL   Albumin 4.1 4.0 - 5.0 g/dL   Globulin, Total 2.4 1.5 - 4.5 g/dL   Albumin/Globulin Ratio 1.7 1.2 - 2.2   Bilirubin Total 0.4 0.0 - 1.2 mg/dL   Alkaline Phosphatase 91 39 - 117 IU/L   AST 28 0 - 40 IU/L   ALT 46 (H) 0 - 44 IU/L  Lipid Panel w/o Chol/HDL Ratio  Result Value Ref Range   Cholesterol, Total 174 100 - 199 mg/dL   Triglycerides 93 0 - 149 mg/dL   HDL 54 >61 mg/dL    VLDL Cholesterol Cal 17 5 - 40 mg/dL   LDL Chol Calc (NIH) 950 (H) 0 - 99 mg/dL  TSH  Result Value Ref Range   TSH 2.570 0.450 - 4.500 uIU/mL  UA/M w/rflx Culture, Routine   Specimen: Urine   URINE  Result Value Ref Range   Specific Gravity, UA 1.025 1.005 - 1.030   pH, UA 5.0 5.0 - 7.5   Color, UA Yellow Yellow   Appearance Ur Clear Clear   Leukocytes,UA Negative Negative   Protein,UA Negative Negative/Trace   Glucose, UA Negative Negative   Ketones, UA Trace (A) Negative   RBC, UA 1+ (A) Negative   Bilirubin, UA Negative Negative   Urobilinogen, Ur 0.2 0.2 - 1.0 mg/dL   Nitrite, UA Negative Negative   Microscopic Examination See below:       Assessment & Plan:   Problem List Items Addressed This Visit      Cardiovascular and Mediastinum   Essential hypertension - Primary    BPs stable and WNL, continue current regimen      Relevant Medications   amLODipine (NORVASC) 5 MG tablet       Follow up plan: Return in about 6 months (around 12/17/2019) for 6 month f/u.

## 2019-10-13 ENCOUNTER — Ambulatory Visit: Payer: Self-pay | Attending: Internal Medicine

## 2019-10-13 DIAGNOSIS — Z23 Encounter for immunization: Secondary | ICD-10-CM

## 2019-10-13 NOTE — Progress Notes (Signed)
   Covid-19 Vaccination Clinic  Name:  [name]    MRN: [id number] DOB: Aug 30, 1975  10/13/2019  [name] was observed post Covid-19 immunization for 30 minutes based on pre-vaccination screening without incident. He was provided with Vaccine Information Sheet and instruction to access the V-Safe system.   [name] was instructed to call 911 with any severe reactions post vaccine: Marland Kitchen Difficulty breathing  . Swelling of face and throat  . A fast heartbeat  . A bad rash all over body  . Dizziness and weakness   Immunizations Administered    Name Date Dose VIS Date Route   Pfizer COVID-19 Vaccine 10/13/2019  4:36 PM 0.3 mL 06/22/2019 Intramuscular   Manufacturer: [employer], [employer]   Lot: [id number]   NDC: [address]-7

## 2019-11-07 ENCOUNTER — Ambulatory Visit: Payer: Self-pay | Attending: Internal Medicine

## 2019-11-07 DIAGNOSIS — Z23 Encounter for immunization: Secondary | ICD-10-CM

## 2019-11-07 NOTE — Progress Notes (Signed)
   Covid-19 Vaccination Clinic  Name:  ANDRW MCGUIRT    MRN: [id number] DOB: 1976-07-01  11/07/2019  Mr. Keelan was observed post Covid-19 immunization for 15 minutes without incident. He was provided with Vaccine Information Sheet and instruction to access the V-Safe system.   Mr. Shanholtzer was instructed to call 911 with any severe reactions post vaccine: Marland Kitchen Difficulty breathing  . Swelling of face and throat  . A fast heartbeat  . A bad rash all over body  . Dizziness and weakness   Immunizations Administered    Name Date Dose VIS Date Route   Pfizer COVID-19 Vaccine 11/07/2019  9:01 AM 0.3 mL 09/05/2018 Intramuscular   Manufacturer: [employer], [employer]   Lot: [id number]   NDC: [address]-8

## 2019-12-17 ENCOUNTER — Ambulatory Visit (INDEPENDENT_AMBULATORY_CARE_PROVIDER_SITE_OTHER): Payer: Managed Care, Other (non HMO) | Admitting: Family Medicine

## 2019-12-17 ENCOUNTER — Other Ambulatory Visit: Payer: Self-pay

## 2019-12-17 ENCOUNTER — Encounter: Payer: Self-pay | Admitting: Family Medicine

## 2019-12-17 VITALS — BP 123/82 | HR 79 | Temp 98.6°F | Wt 293.0 lb

## 2019-12-17 DIAGNOSIS — I10 Essential (primary) hypertension: Secondary | ICD-10-CM | POA: Diagnosis not present

## 2019-12-17 MED ORDER — AMLODIPINE BESYLATE 5 MG PO TABS: 5.0000 mg | ORAL_TABLET | Freq: Every day | ORAL | 1 refills | Status: DC

## 2019-12-17 NOTE — Assessment & Plan Note (Signed)
BPs stable and WNL, continue current regimen 

## 2019-12-17 NOTE — Progress Notes (Signed)
BP 123/82   Pulse 79   Temp 98.6 F (37 C) (Oral)   Wt 293 lb (132.9 kg)   SpO2 98%   BMI 36.62 kg/m    Subjective:    Patient ID: [name], male    DOB: [birth year], 44 y.o.   MRN: [id number]  HPI: [name] is a 44 y.o. male  Chief Complaint  Patient presents with  . Hypertension   Here today for HTN f/u. Home BPs 130/70s typically. Taking medication faithfully without side effects. Denies CP, SOB, HAs, dizziness. Trying to eat well and stay active. No new concerns today.   Relevant past medical, surgical, family and social history reviewed and updated as indicated. Interim medical history since our last visit reviewed. Allergies and medications reviewed and updated.  Review of Systems  Per HPI unless specifically indicated above     Objective:    BP 123/82   Pulse 79   Temp 98.6 F (37 C) (Oral)   Wt 293 lb (132.9 kg)   SpO2 98%   BMI 36.62 kg/m   Wt Readings from Last 3 Encounters:  12/17/19 293 lb (132.9 kg)  06/18/19 290 lb (131.5 kg)  05/14/19 294 lb 9.6 oz (133.6 kg)    Physical Exam Vitals and nursing note reviewed.  Constitutional:      Appearance: Normal appearance.  HENT:     Head: Atraumatic.  Eyes:     Extraocular Movements: Extraocular movements intact.     Conjunctiva/sclera: Conjunctivae normal.  Cardiovascular:     Rate and Rhythm: Normal rate and regular rhythm.  Pulmonary:     Effort: Pulmonary effort is normal.     Breath sounds: Normal breath sounds.  Musculoskeletal:        General: Normal range of motion.     Cervical back: Normal range of motion and neck supple.  Skin:    General: Skin is warm and dry.  Neurological:     General: No focal deficit present.     Mental Status: He is oriented to person, place, and time.  Psychiatric:        Mood and Affect: Mood normal.        Thought Content: Thought content normal.        Judgment: Judgment normal.     Results for orders placed or performed in visit on  05/14/19  Microscopic Examination   URINE  Result Value Ref Range   WBC, UA 0-5 0 - 5 /hpf   RBC 0-2 0 - 2 /hpf   Epithelial Cells (non renal) 0-10 0 - 10 /hpf   Bacteria, UA None seen None seen/Few  CBC with Differential/Platelet  Result Value Ref Range   WBC 6.2 3.4 - 10.8 x10E3/uL   RBC 5.10 4.14 - 5.80 x10E6/uL   Hemoglobin 17.3 13.0 - 17.7 g/dL   Hematocrit 73.4 19.3 - 51.0 %   MCV 97 79 - 97 fL   MCH 33.9 (H) 26.6 - 33.0 pg   MCHC 34.9 31.5 - 35.7 g/dL   RDW 79.0 24.0 - 97.3 %   Platelets 210 150 - 450 x10E3/uL   Neutrophils 64 Not Estab. %   Lymphs 21 Not Estab. %   Monocytes 9 Not Estab. %   Eos 4 Not Estab. %   Basos 1 Not Estab. %   Neutrophils Absolute 4.0 1.4 - 7.0 x10E3/uL   Lymphocytes Absolute 1.3 0.7 - 3.1 x10E3/uL   Monocytes Absolute 0.5 0.1 - 0.9 x10E3/uL  EOS (ABSOLUTE) 0.3 0.0 - 0.4 x10E3/uL   Basophils Absolute 0.1 0.0 - 0.2 x10E3/uL   Immature Granulocytes 1 Not Estab. %   Immature Grans (Abs) 0.0 0.0 - 0.1 x10E3/uL  Comprehensive metabolic panel  Result Value Ref Range   Glucose 97 65 - 99 mg/dL   BUN 11 6 - 24 mg/dL   Creatinine, Ser 0.86 0.76 - 1.27 mg/dL   GFR calc non Af Amer 105 >59 mL/min/1.73   GFR calc Af Amer 121 >59 mL/min/1.73   BUN/Creatinine Ratio 13 9 - 20   Sodium 140 134 - 144 mmol/L   Potassium 4.4 3.5 - 5.2 mmol/L   Chloride 104 96 - 106 mmol/L   CO2 20 20 - 29 mmol/L   Calcium 8.8 8.7 - 10.2 mg/dL   Total Protein 6.5 6.0 - 8.5 g/dL   Albumin 4.1 4.0 - 5.0 g/dL   Globulin, Total 2.4 1.5 - 4.5 g/dL   Albumin/Globulin Ratio 1.7 1.2 - 2.2   Bilirubin Total 0.4 0.0 - 1.2 mg/dL   Alkaline Phosphatase 91 39 - 117 IU/L   AST 28 0 - 40 IU/L   ALT 46 (H) 0 - 44 IU/L  Lipid Panel w/o Chol/HDL Ratio  Result Value Ref Range   Cholesterol, Total 174 100 - 199 mg/dL   Triglycerides 93 0 - 149 mg/dL   HDL 54 >39 mg/dL   VLDL Cholesterol Cal 17 5 - 40 mg/dL   LDL Chol Calc (NIH) 103 (H) 0 - 99 mg/dL  TSH  Result Value Ref Range    TSH 2.570 0.450 - 4.500 uIU/mL  UA/M w/rflx Culture, Routine   Specimen: Urine   URINE  Result Value Ref Range   Specific Gravity, UA 1.025 1.005 - 1.030   pH, UA 5.0 5.0 - 7.5   Color, UA Yellow Yellow   Appearance Ur Clear Clear   Leukocytes,UA Negative Negative   Protein,UA Negative Negative/Trace   Glucose, UA Negative Negative   Ketones, UA Trace (A) Negative   RBC, UA 1+ (A) Negative   Bilirubin, UA Negative Negative   Urobilinogen, Ur 0.2 0.2 - 1.0 mg/dL   Nitrite, UA Negative Negative   Microscopic Examination See below:       Assessment & Plan:   Problem List Items Addressed This Visit      Cardiovascular and Mediastinum   Essential hypertension - Primary    BPs stable and WNL, continue current regimen      Relevant Medications   amLODipine (NORVASC) 5 MG tablet   Other Relevant Orders   Comprehensive metabolic panel       Follow up plan: Return in about 6 months (around 06/17/2020) for CPE.

## 2019-12-18 LAB — COMPREHENSIVE METABOLIC PANEL
ALT: 31 IU/L (ref 0–44)
AST: 21 IU/L (ref 0–40)
Albumin/Globulin Ratio: 2 (ref 1.2–2.2)
Albumin: 4.5 g/dL (ref 4.0–5.0)
Alkaline Phosphatase: 87 IU/L (ref 48–121)
BUN/Creatinine Ratio: 14 (ref 9–20)
BUN: 11 mg/dL (ref 6–24)
Bilirubin Total: 0.3 mg/dL (ref 0.0–1.2)
CO2: 25 mmol/L (ref 20–29)
Calcium: 9.3 mg/dL (ref 8.7–10.2)
Chloride: 105 mmol/L (ref 96–106)
Creatinine, Ser: 0.79 mg/dL (ref 0.76–1.27)
GFR calc Af Amer: 125 mL/min/{1.73_m2} (ref >59)
GFR calc non Af Amer: 108 mL/min/{1.73_m2} (ref >59)
Globulin, Total: 2.3 g/dL (ref 1.5–4.5)
Glucose: 93 mg/dL (ref 65–99)
Potassium: 5 mmol/L (ref 3.5–5.2)
Sodium: 142 mmol/L (ref 134–144)
Total Protein: 6.8 g/dL (ref 6.0–8.5)

## 2020-02-20 ENCOUNTER — Encounter: Payer: Self-pay | Admitting: Family Medicine

## 2020-06-15 ENCOUNTER — Other Ambulatory Visit: Payer: Self-pay | Admitting: Family Medicine

## 2020-06-15 NOTE — Telephone Encounter (Signed)
Requested Prescriptions  Pending Prescriptions Disp Refills  . amLODipine (NORVASC) 5 MG tablet [Pharmacy Med Name: AMLODIPINE BESYLATE 5 MG TAB] 90 tablet 0    Sig: TAKE 1 TABLET BY MOUTH EVERY DAY     Cardiovascular:  Calcium Channel Blockers Failed - 06/15/2020  9:25 AM      Failed - Valid encounter within last 6 months    Recent Outpatient Visits          6 months ago Essential hypertension   [facility] Particia Nearing, [place]   12 months ago Essential hypertension   [facility] Part[place], [place]   1 year ago Annual physical exam   [facility] Particia N[place], [place]   1 year ago Elevated blood pressure reading   [facility] Particia N[place], [place]   3 years ago Annual physical exam   [facility] Particia N[place], [place]      Future Appointments            In 3 days Rumball, Darl Householder, DO [facility], [facility]           Passed - Last BP in normal range    BP Readings from Last 1 Encounters:  12/17/19 123/82

## 2020-06-17 NOTE — Progress Notes (Signed)
BP 130/79   Pulse 82   Temp 98.4 F (36.9 C)   Ht 6' 3.43" (1.916 m)   Wt 276 lb 2 oz (125.2 kg)   SpO2 97%   BMI 34.12 kg/m    Subjective:    Patient ID: [name], male    DOB: 11/07/1975, 44 y.o.   MRN: [id number]  HPI: Corey FennelRobert J Stutz is a 44 y.o. male presenting on 06/18/2020 for comprehensive medical examination. Current medical complaints include:none   Hypertension: - Medications: amlodipine 5mg  daily - Compliance: good - Checking BP at home: occasionally, has wrist cuff, 120s SBP - Denies any SOB, CP, vision changes, LE edema, medication SEs, or symptoms of hypotension - Diet: 1-2 meals per day, eats lots of vegetables - Exercise: none formally but tries to stay active (parks far from store, etc)  He currently lives with: wife Interim Problems from his last visit: no  Depression Screen done today and results listed below:  Depression screen [facility] 2/9 06/18/2020 05/14/2019 04/23/2019 05/12/2017  Decreased Interest 0 0 0 0  Down, Depressed, Hopeless 0 0 0 0  PHQ - 2 Score 0 0 0 0    The patient does not have a history of falls. I did not complete a risk assessment for falls. A plan of care for falls was not documented.  Past Medical History:  Past Medical History:  Diagnosis Date  . Allergy   . Essential hypertension   . Obesity    Surgical History:  Past Surgical History:  Procedure Laterality Date  . APPENDECTOMY    . BACK SURGERY    . HERNIA REPAIR    . SPINE SURGERY    . TONSILLECTOMY    . TUMOR REMOVAL  2001   benign mass removed from behind left ear  . VARICOSE VEIN SURGERY Bilateral 04/12/2019    Medications:  Current Outpatient Medications on File Prior to Visit  Medication Sig  . amLODipine (NORVASC) 5 MG tablet TAKE 1 TABLET BY MOUTH EVERY DAY  . ibuprofen (ADVIL,MOTRIN) 200 MG tablet Take 200 mg every 6 (six) hours as needed by mouth.   No current facility-administered medications on file prior to visit.    Allergies:   Allergies  Allergen Reactions  . Erythromycin Anaphylaxis  . Penicillins Anaphylaxis  . Sulfa Antibiotics Anaphylaxis    Social History:  Social History   Socioeconomic History  . Marital status: Married    Spouse name: Not on file  . Number of children: Not on file  . Years of education: Not on file  . Highest education level: Not on file  Occupational History  . Not on file  Tobacco Use  . Smoking status: Current Every Day Smoker    Packs/day: 1.00    Years: 20.00    Pack years: 20.00    Types: Cigarettes  . Smokeless tobacco: Never Used  Vaping Use  . Vaping Use: Never used  Substance and Sexual Activity  . Alcohol use: Yes    Alcohol/week: 5.0 - 6.0 standard drinks    Types: 5 - 6 Shots of liquor per week  . Drug use: No  . Sexual activity: Yes    Birth control/protection: None  Other Topics Concern  . Not on file  Social History Narrative  . Not on file   Social Determinants of Health   Financial Resource Strain:   . Difficulty of Paying Living Expenses: Not on file  Food Insecurity:   . Worried About [employer] Out of  Food in the Last Year: Not on file  . Ran Out of Food in the Last Year: Not on file  Transportation Needs:   . Lack of Transportation (Medical): Not on file  . Lack of Transportation (Non-Medical): Not on file  Physical Activity:   . Days of Exercise per Week: Not on file  . Minutes of Exercise per Session: Not on file  Stress:   . Feeling of Stress : Not on file  Social Connections:   . Frequency of Communication with Friends and Family: Not on file  . Frequency of Social Gatherings with Friends and Family: Not on file  . Attends Religious Services: Not on file  . Active Member of Clubs or Organizations: Not on file  . Attends Banker Meetings: Not on file  . Marital Status: Not on file  Intimate Partner Violence:   . Fear of Current or Ex-Partner: Not on file  . Emotionally Abused: Not on file  . Physically Abused: Not  on file  . Sexually Abused: Not on file   Social History   Tobacco Use  Smoking Status Current Every Day Smoker  . Packs/day: 1.00  . Years: 20.00  . Pack years: 20.00  . Types: Cigarettes  Smokeless Tobacco Never Used   Social History   Substance and Sexual Activity  Alcohol Use Yes  . Alcohol/week: 5.0 - 6.0 standard drinks  . Types: 5 - 6 Shots of liquor per week   Family History:  Family History  Problem Relation Age of Onset  . Heart disease Paternal Grandfather   . COPD Neg Hx   . Diabetes Neg Hx   . Hypertension Neg Hx   . Stroke Neg Hx    Past medical history, surgical history, medications, allergies, family history and social history reviewed with patient today and changes made to appropriate areas of the chart.   Review of Systems - Denies chest pain, shortness of breath, abdominal pain, difficulties eating/drinking/voiding/stooling, leg swelling, vision changes. All other ROS negative except what is listed above and in the HPI.      Objective:    BP 130/79   Pulse 82   Temp 98.4 F (36.9 C)   Ht 6' 3.43" (1.916 m)   Wt 276 lb 2 oz (125.2 kg)   SpO2 97%   BMI 34.12 kg/m   Wt Readings from Last 3 Encounters:  06/18/20 276 lb 2 oz (125.2 kg)  12/17/19 293 lb (132.9 kg)  06/18/19 290 lb (131.5 kg)    Physical Exam Constitutional:      Appearance: Normal appearance.  HENT:     Head: Normocephalic.     Right Ear: External ear normal.     Left Ear: External ear normal.     Nose: Nose normal.     Mouth/Throat:     Mouth: Mucous membranes are moist.     Pharynx: Oropharynx is clear.  Eyes:     Extraocular Movements: Extraocular movements intact.     Pupils: Pupils are equal, round, and reactive to light.  Cardiovascular:     Rate and Rhythm: Normal rate and regular rhythm.     Heart sounds: Normal heart sounds. No murmur heard.   Pulmonary:     Effort: Pulmonary effort is normal.     Breath sounds: Normal breath sounds.  Abdominal:      General: Bowel sounds are normal. There is no distension.     Palpations: Abdomen is soft.     Tenderness: There  is no abdominal tenderness. There is no guarding or rebound.  Musculoskeletal:        General: Normal range of motion.     Cervical back: Normal range of motion.     Right lower leg: No edema.     Left lower leg: No edema.  Skin:    General: Skin is warm.  Neurological:     Mental Status: He is alert and oriented to person, place, and time. Mental status is at baseline.  Psychiatric:        Mood and Affect: Mood normal.        Behavior: Behavior normal.     Results for orders placed or performed in visit on 12/17/19  Comprehensive metabolic panel  Result Value Ref Range   Glucose 93 65 - 99 mg/dL   BUN 11 6 - 24 mg/dL   Creatinine, Ser 3.66 0.76 - 1.27 mg/dL   GFR calc non Af Amer 108 >59 mL/min/1.73   GFR calc Af Amer 125 >59 mL/min/1.73   BUN/Creatinine Ratio 14 9 - 20   Sodium 142 134 - 144 mmol/L   Potassium 5.0 3.5 - 5.2 mmol/L   Chloride 105 96 - 106 mmol/L   CO2 25 20 - 29 mmol/L   Calcium 9.3 8.7 - 10.2 mg/dL   Total Protein 6.8 6.0 - 8.5 g/dL   Albumin 4.5 4.0 - 5.0 g/dL   Globulin, Total 2.3 1.5 - 4.5 g/dL   Albumin/Globulin Ratio 2.0 1.2 - 2.2   Bilirubin Total 0.3 0.0 - 1.2 mg/dL   Alkaline Phosphatase 87 48 - 121 IU/L   AST 21 0 - 40 IU/L   ALT 31 0 - 44 IU/L      Assessment & Plan:   Problem List Items Addressed This Visit      Cardiovascular and Mediastinum   Essential hypertension - Primary    At goal without orthostatic symptoms. Continue current regimen. Obtaining labs today.      Relevant Orders   Basic Metabolic Panel (BMET)   Lipid panel     Other   Tobacco use disorder    20 pack year history. Cessation counseling provided today, not ready to quit. Hotline number provided.        Other Visit Diagnoses    Need for hepatitis C screening test       Relevant Orders   Hepatitis C antibody       LABORATORY TESTING:  Health  maintenance labs ordered today as discussed above.   IMMUNIZATIONS:   - Tdap: Tetanus vaccination status reviewed: last tetanus booster within 10 years. - Influenza: Refused - Pneumovax: Not applicable - Prevnar: Not applicable - HPV: Not applicable - Shingrix vaccine: Not applicable  SCREENING: - Colonoscopy: Not applicable  Discussed with patient purpose of the colonoscopy is to detect colon cancer at curable precancerous or early stages  - Low dose chest CT: Not applicable - AAA Screening: Not applicable   PATIENT COUNSELING:    Sexuality: Discussed sexually transmitted diseases, partner selection, use of condoms, avoidance of unintended pregnancy  and contraceptive alternatives.   Advised to avoid cigarette smoking.  I discussed with the patient that most people either abstain from alcohol or drink within safe limits (<=14/week and <=4 drinks/occasion for males, <=7/weeks and <= 3 drinks/occasion for females) and that the risk for alcohol disorders and other health effects rises proportionally with the number of drinks per week and how often a drinker exceeds daily limits.  Discussed cessation/primary prevention  of drug use and availability of treatment for abuse.   Diet: Encouraged to adjust caloric intake to maintain  or achieve ideal body weight, to reduce intake of dietary saturated fat and total fat, to limit sodium intake by avoiding high sodium foods and not adding table salt, and to maintain adequate dietary potassium and calcium preferably from fresh fruits, vegetables, and low-fat dairy products.    stressed the importance of regular exercise  Injury prevention: Discussed safety belts, safety helmets, smoke detector, smoking near bedding or upholstery.   Dental health: Discussed importance of regular tooth brushing, flossing, and dental visits.   Follow up plan: NEXT PREVENTATIVE PHYSICAL DUE IN 1 YEAR. Return in about 6 months (around 12/17/2020) for HTN.

## 2020-06-18 ENCOUNTER — Ambulatory Visit (INDEPENDENT_AMBULATORY_CARE_PROVIDER_SITE_OTHER): Payer: Managed Care, Other (non HMO) | Admitting: Family Medicine

## 2020-06-18 ENCOUNTER — Encounter: Payer: Self-pay | Admitting: Family Medicine

## 2020-06-18 ENCOUNTER — Other Ambulatory Visit: Payer: Self-pay

## 2020-06-18 VITALS — BP 130/79 | HR 82 | Temp 98.4°F | Ht 75.43 in | Wt 276.1 lb

## 2020-06-18 DIAGNOSIS — Z0000 Encounter for general adult medical examination without abnormal findings: Secondary | ICD-10-CM | POA: Diagnosis not present

## 2020-06-18 DIAGNOSIS — Z1159 Encounter for screening for other viral diseases: Secondary | ICD-10-CM

## 2020-06-18 DIAGNOSIS — I10 Essential (primary) hypertension: Secondary | ICD-10-CM

## 2020-06-18 DIAGNOSIS — Z720 Tobacco use: Secondary | ICD-10-CM | POA: Insufficient documentation

## 2020-06-18 DIAGNOSIS — F17200 Nicotine dependence, unspecified, uncomplicated: Secondary | ICD-10-CM

## 2020-06-18 NOTE — Assessment & Plan Note (Signed)
At goal without orthostatic symptoms. Continue current regimen. Obtaining labs today.

## 2020-06-18 NOTE — Assessment & Plan Note (Signed)
20 pack year history. Cessation counseling provided today, not ready to quit. Hotline number provided.

## 2020-06-18 NOTE — Patient Instructions (Signed)
It was great to see you!  Our plans for today:  - No changes to your medications today.  - I recommend stopping smoking. Let us know if you would like help with this. There is a free 24/7 hotline with counseling and supplies to help quitting at 1-800-QUIT-NOW. - Congratulations on your weight loss! Keep up the great work!  We are checking some labs today, we will release these results to your MyChart.  Take care and seek immediate care sooner if you develop any concerns.   Dr. Linwood Dibbles  Things to do to keep yourself healthy  - Exercise at least 30-45 minutes a day, 3-4 days a week.  - Eat a low-fat diet with lots of fruits and vegetables, up to 7-9 servings per day.  - Seatbelts can save your life. Wear them always.  - Smoke detectors on every level of your home, check batteries every year.  - Eye Doctor - have an eye exam every 1-2 years  - Safe sex - if you may be exposed to STDs, use a condom.  - Alcohol -  If you drink, do it moderately, less than 2 drinks per day.  - Health Care Power of Attorney. Choose someone to speak for you if you are not able. https://www.prepareforyourcare.org is a great website to help you navigate this. - Depression is common in our stressful world.If you're feeling down or losing interest in things you normally enjoy, please come in for a visit.  - Violence - If anyone is threatening or hurting you, please call immediately.

## 2020-06-19 LAB — LIPID PANEL
Chol/HDL Ratio: 2.2 ratio (ref 0.0–5.0)
Cholesterol, Total: 165 mg/dL (ref 100–199)
HDL: 75 mg/dL (ref >39)
LDL Chol Calc (NIH): 71 mg/dL (ref 0–99)
Triglycerides: 109 mg/dL (ref 0–149)
VLDL Cholesterol Cal: 19 mg/dL (ref 5–40)

## 2020-06-19 LAB — BASIC METABOLIC PANEL
BUN/Creatinine Ratio: 10 (ref 9–20)
BUN: 8 mg/dL (ref 6–24)
CO2: 23 mmol/L (ref 20–29)
Calcium: 9.2 mg/dL (ref 8.7–10.2)
Chloride: 107 mmol/L — ABNORMAL HIGH (ref 96–106)
Creatinine, Ser: 0.77 mg/dL (ref 0.76–1.27)
GFR calc Af Amer: 126 mL/min/{1.73_m2} (ref >59)
GFR calc non Af Amer: 109 mL/min/{1.73_m2} (ref >59)
Glucose: 65 mg/dL (ref 65–99)
Potassium: 4.5 mmol/L (ref 3.5–5.2)
Sodium: 145 mmol/L — ABNORMAL HIGH (ref 134–144)

## 2020-06-19 LAB — HEPATITIS C ANTIBODY: Hep C Virus Ab: 0.3 s/co ratio (ref 0.0–0.9)

## 2020-06-30 ENCOUNTER — Encounter: Payer: Managed Care, Other (non HMO) | Admitting: Family Medicine

## 2020-06-30 ENCOUNTER — Encounter: Payer: Managed Care, Other (non HMO) | Admitting: Nurse Practitioner

## 2020-07-01 ENCOUNTER — Encounter: Payer: Managed Care, Other (non HMO) | Admitting: Family Medicine

## 2020-09-09 ENCOUNTER — Other Ambulatory Visit: Payer: Self-pay | Admitting: Family Medicine

## 2020-09-09 NOTE — Telephone Encounter (Signed)
Requested medications are due for refill today.  Yes  Requested medications are on the active medications list.  Yes  Last refill. 06/15/2020  Future visit scheduled.   no  Notes to clinic.  Last filled by Dr. Linwood Dibbles.

## 2020-09-09 NOTE — Telephone Encounter (Signed)
Patient is not due to return until June

## 2021-03-14 ENCOUNTER — Other Ambulatory Visit: Payer: Self-pay | Admitting: Nurse Practitioner

## 2021-03-14 NOTE — Telephone Encounter (Signed)
Requested Prescriptions  Pending Prescriptions Disp Refills  . amLODipine (NORVASC) 5 MG tablet [Pharmacy Med Name: AMLODIPINE BESYLATE 5 MG TAB] 90 tablet 0    Sig: TAKE 1 TABLET BY MOUTH EVERY DAY     Cardiovascular:  Calcium Channel Blockers Failed - 03/14/2021  9:00 AM      Failed - Valid encounter within last 6 months    Recent Outpatient Visits          8 months ago Essential hypertension   [facility] Caro Laroche, DO   1 year ago Essential hypertension   [facility] Part[facility] N[place], [place]   1 year ago Essential hypertension   [facility] Part[facility] N[place], [place]   1 year ago Annual physical exam   [facility] Part[facility] N[place], [place]   1 year ago Elevated blood pressure reading   [facility], Salley Hews, [place]      Future Appointments            In 1 week Larae Grooms, NP [facility], [facility]           Passed - Last BP in normal range    BP Readings from Last 1 Encounters:  06/18/20 130/79

## 2021-03-24 ENCOUNTER — Ambulatory Visit: Payer: Managed Care, Other (non HMO) | Admitting: Nurse Practitioner

## 2021-03-24 DIAGNOSIS — E7800 Pure hypercholesterolemia, unspecified: Secondary | ICD-10-CM | POA: Insufficient documentation

## 2021-03-24 NOTE — Progress Notes (Signed)
BP 118/80   Pulse 96   Temp 98.9 F (37.2 C) (Oral)   Ht 6' 2.1" (1.882 m)   Wt 281 lb 9.6 oz (127.7 kg)   SpO2 98%   BMI 36.06 kg/m    Subjective:    Patient ID: [name], male    DOB: [birth year], 45 y.o.   MRN: [id number]  HPI: [name] is a 45 y.o. male  Chief Complaint  Patient presents with   Hypertension    HYPERTENSION / HYPERLIPIDEMIA Satisfied with current treatment? no Duration of hypertension: years BP monitoring frequency: not checking BP range:  BP medication side effects: no Past BP meds: amlodipine Duration of hyperlipidemia: years Cholesterol medication side effects: no Cholesterol supplements: none Past cholesterol medications: none Medication compliance: excellent compliance Aspirin: no Recent stressors: no Recurrent headaches: no Visual changes: no Palpitations: no Dyspnea: no Chest pain: no Lower extremity edema: no Dizzy/lightheaded: no  SMOKING CESSATION Smoking Status: current everyday smoker Smoking Amount: 1PPD  Smoking Onset:  Smoking Quit Date:  Smoking triggers: Type of tobacco use:  Pneumovax:    Relevant past medical, surgical, family and social history reviewed and updated as indicated. Interim medical history since our last visit reviewed. Allergies and medications reviewed and updated.  Review of Systems  Eyes:  Negative for visual disturbance.  Respiratory:  Negative for shortness of breath.   Cardiovascular:  Negative for chest pain and leg swelling.  Neurological:  Positive for dizziness. Negative for light-headedness and headaches.   Per HPI unless specifically indicated above     Objective:    BP 118/80   Pulse 96   Temp 98.9 F (37.2 C) (Oral)   Ht 6' 2.1" (1.882 m)   Wt 281 lb 9.6 oz (127.7 kg)   SpO2 98%   BMI 36.06 kg/m   Wt Readings from Last 3 Encounters:  03/25/21 281 lb 9.6 oz (127.7 kg)  06/18/20 276 lb 2 oz (125.2 kg)  12/17/19 293 lb (132.9 kg)    Physical  Exam Vitals and nursing note reviewed.  Constitutional:      General: He is not in acute distress.    Appearance: Normal appearance. He is not ill-appearing, toxic-appearing or diaphoretic.  HENT:     Head: Normocephalic.     Right Ear: External ear normal. A middle ear effusion is present.     Left Ear: External ear normal. A middle ear effusion is present.     Nose: Nose normal. No congestion or rhinorrhea.     Mouth/Throat:     Mouth: Mucous membranes are moist.  Eyes:     General:        Right eye: No discharge.        Left eye: No discharge.     Extraocular Movements: Extraocular movements intact.     Conjunctiva/sclera: Conjunctivae normal.     Pupils: Pupils are equal, round, and reactive to light.  Cardiovascular:     Rate and Rhythm: Normal rate and regular rhythm.     Heart sounds: No murmur heard. Pulmonary:     Effort: Pulmonary effort is normal. No respiratory distress.     Breath sounds: Normal breath sounds. No wheezing, rhonchi or rales.  Abdominal:     General: Abdomen is flat. Bowel sounds are normal.  Musculoskeletal:     Cervical back: Normal range of motion and neck supple.  Skin:    General: Skin is warm and dry.     Capillary Refill: Capillary refill  takes less than 2 seconds.  Neurological:     General: No focal deficit present.     Mental Status: He is alert and oriented to person, place, and time.  Psychiatric:        Mood and Affect: Mood normal.        Behavior: Behavior normal.        Thought Content: Thought content normal.        Judgment: Judgment normal.    Results for orders placed or performed in visit on 08/23/15  Basic Metabolic Panel (BMET)  Result Value Ref Range   Glucose 65 65 - 99 mg/dL   BUN 8 6 - 24 mg/dL   Creatinine, Ser 0.77 0.76 - 1.27 mg/dL   GFR calc non Af Amer 109 >59 mL/min/1.73   GFR calc Af Amer 126 >59 mL/min/1.73   BUN/Creatinine Ratio 10 9 - 20   Sodium 145 (H) 134 - 144 mmol/L   Potassium 4.5 3.5 - 5.2  mmol/L   Chloride 107 (H) 96 - 106 mmol/L   CO2 23 20 - 29 mmol/L   Calcium 9.2 8.7 - 10.2 mg/dL  Lipid panel  Result Value Ref Range   Cholesterol, Total 165 100 - 199 mg/dL   Triglycerides 109 0 - 149 mg/dL   HDL 75 >39 mg/dL   VLDL Cholesterol Cal 19 5 - 40 mg/dL   LDL Chol Calc (NIH) 71 0 - 99 mg/dL   Chol/HDL Ratio 2.2 0.0 - 5.0 ratio  Hepatitis C antibody  Result Value Ref Range   Hep C Virus Ab 0.3 0.0 - 0.9 s/co ratio      Assessment & Plan:   Problem List Items Addressed This Visit       Cardiovascular and Mediastinum   Essential hypertension - Primary    Chronic.  Controlled.  Continue with current medication regimen of Amlodipine 9m.  Labs ordered today.  Refills sent today.  Return to clinic in 6 months for reevaluation.  Call sooner if concerns arise.        Relevant Medications   amLODipine (NORVASC) 5 MG tablet   Other Relevant Orders   Comp Met (CMET)     Other   Tobacco use    Current everyday smoker. Smoking about 1-1.5ppd.  Refused pneumonia vaccine.      Elevated LDL cholesterol level    Labs ordered today. Will make recommendations based on lab results.      Relevant Orders   Lipid Profile   Other Visit Diagnoses     Nonspecific dizziness       Recommend using Zyrtec daily to help with fluid behind the ears. If symptoms do not improve RTC for reevaluation.        Follow up plan: Return in about 6 months (around 09/22/2021) for Physical and Fasting labs.

## 2021-03-25 ENCOUNTER — Encounter: Payer: Self-pay | Admitting: Nurse Practitioner

## 2021-03-25 ENCOUNTER — Other Ambulatory Visit: Payer: Self-pay

## 2021-03-25 ENCOUNTER — Ambulatory Visit (INDEPENDENT_AMBULATORY_CARE_PROVIDER_SITE_OTHER): Payer: BC Managed Care – PPO | Admitting: Nurse Practitioner

## 2021-03-25 VITALS — BP 118/80 | HR 96 | Temp 98.9°F | Ht 74.1 in | Wt 281.6 lb

## 2021-03-25 DIAGNOSIS — I10 Essential (primary) hypertension: Secondary | ICD-10-CM

## 2021-03-25 DIAGNOSIS — E7800 Pure hypercholesterolemia, unspecified: Secondary | ICD-10-CM | POA: Diagnosis not present

## 2021-03-25 DIAGNOSIS — R42 Dizziness and giddiness: Secondary | ICD-10-CM | POA: Diagnosis not present

## 2021-03-25 DIAGNOSIS — Z720 Tobacco use: Secondary | ICD-10-CM

## 2021-03-25 MED ORDER — AMLODIPINE BESYLATE 5 MG PO TABS: 5.0000 mg | ORAL_TABLET | Freq: Every day | ORAL | 1 refills | Status: DC

## 2021-03-25 NOTE — Assessment & Plan Note (Signed)
Chronic.  Controlled.  Continue with current medication regimen of Amlodipine 5mg .  Labs ordered today.  Refills sent today.  Return to clinic in 6 months for reevaluation.  Call sooner if concerns arise.

## 2021-03-25 NOTE — Assessment & Plan Note (Signed)
Labs ordered today.  Will make recommendations based on lab results. ?

## 2021-03-25 NOTE — Assessment & Plan Note (Signed)
Current everyday smoker. Smoking about 1-1.5ppd.  Refused pneumonia vaccine.

## 2021-03-26 LAB — LIPID PANEL
Chol/HDL Ratio: 2.4 ratio (ref 0.0–5.0)
Cholesterol, Total: 190 mg/dL (ref 100–199)
HDL: 80 mg/dL (ref >39)
LDL Chol Calc (NIH): 91 mg/dL (ref 0–99)
Triglycerides: 107 mg/dL (ref 0–149)
VLDL Cholesterol Cal: 19 mg/dL (ref 5–40)

## 2021-03-26 LAB — COMPREHENSIVE METABOLIC PANEL
ALT: 78 IU/L — ABNORMAL HIGH (ref 0–44)
AST: 52 IU/L — ABNORMAL HIGH (ref 0–40)
Albumin/Globulin Ratio: 2 (ref 1.2–2.2)
Albumin: 4.5 g/dL (ref 4.0–5.0)
Alkaline Phosphatase: 70 IU/L (ref 44–121)
BUN/Creatinine Ratio: 16 (ref 9–20)
BUN: 12 mg/dL (ref 6–24)
Bilirubin Total: 0.3 mg/dL (ref 0.0–1.2)
CO2: 22 mmol/L (ref 20–29)
Calcium: 9.1 mg/dL (ref 8.7–10.2)
Chloride: 106 mmol/L (ref 96–106)
Creatinine, Ser: 0.75 mg/dL — ABNORMAL LOW (ref 0.76–1.27)
Globulin, Total: 2.2 g/dL (ref 1.5–4.5)
Glucose: 92 mg/dL (ref 65–99)
Potassium: 4.2 mmol/L (ref 3.5–5.2)
Sodium: 145 mmol/L — ABNORMAL HIGH (ref 134–144)
Total Protein: 6.7 g/dL (ref 6.0–8.5)
eGFR: 112 mL/min/{1.73_m2} (ref >59)

## 2021-03-26 NOTE — Progress Notes (Signed)
Hi [name].  Overall your lab work looks good. Your liver enzymes are elevated.  Recommend avoiding tylenol and alcohol and we will reach at your next visit.  Your cholesterol looks good.  See you are your next visit.

## 2021-09-24 ENCOUNTER — Ambulatory Visit: Payer: BC Managed Care – PPO | Admitting: Nurse Practitioner

## 2021-10-05 ENCOUNTER — Encounter: Payer: Self-pay | Admitting: Nurse Practitioner

## 2021-10-05 ENCOUNTER — Ambulatory Visit (INDEPENDENT_AMBULATORY_CARE_PROVIDER_SITE_OTHER): Payer: BC Managed Care – PPO | Admitting: Nurse Practitioner

## 2021-10-05 ENCOUNTER — Other Ambulatory Visit: Payer: Self-pay

## 2021-10-05 VITALS — BP 138/84 | HR 78 | Temp 98.9°F | Ht 75.0 in | Wt 277.8 lb

## 2021-10-05 DIAGNOSIS — Z1211 Encounter for screening for malignant neoplasm of colon: Secondary | ICD-10-CM | POA: Diagnosis not present

## 2021-10-05 DIAGNOSIS — Z23 Encounter for immunization: Secondary | ICD-10-CM

## 2021-10-05 DIAGNOSIS — E7800 Pure hypercholesterolemia, unspecified: Secondary | ICD-10-CM

## 2021-10-05 DIAGNOSIS — Z0000 Encounter for general adult medical examination without abnormal findings: Secondary | ICD-10-CM | POA: Diagnosis not present

## 2021-10-05 DIAGNOSIS — I10 Essential (primary) hypertension: Secondary | ICD-10-CM

## 2021-10-05 LAB — MICROSCOPIC EXAMINATION
Bacteria, UA: NONE SEEN
WBC, UA: NONE SEEN /hpf (ref 0–5)

## 2021-10-05 LAB — URINALYSIS, ROUTINE W REFLEX MICROSCOPIC
Bilirubin, UA: NEGATIVE
Glucose, UA: NEGATIVE
Ketones, UA: NEGATIVE
Leukocytes,UA: NEGATIVE
Nitrite, UA: NEGATIVE
Specific Gravity, UA: 1.02 (ref 1.005–1.030)
Urobilinogen, Ur: 0.2 mg/dL (ref 0.2–1.0)
pH, UA: 7.5 (ref 5.0–7.5)

## 2021-10-05 MED ORDER — AMLODIPINE BESYLATE 5 MG PO TABS: 5.0000 mg | ORAL_TABLET | Freq: Every day | ORAL | 1 refills | Status: DC

## 2021-10-05 NOTE — Assessment & Plan Note (Signed)
Labs ordered today.  Will make recommendations based on lab results. ?

## 2021-10-05 NOTE — Assessment & Plan Note (Addendum)
Chronic.  Controlled.  Continue with current medication regimen of Amlodipine 5mg  daily.  Refill sent today.  Labs ordered today.  Return to clinic in 6 months for reevaluation.  Call sooner if concerns arise.  ? ?

## 2021-10-05 NOTE — Progress Notes (Signed)
? ?BP 138/84   Pulse 78   Temp 98.9 ?F (37.2 ?C) (Oral)   Ht '6\' 3"'  (1.905 m)   Wt 277 lb 12.8 oz (126 kg)   SpO2 99%   BMI 34.72 kg/m?   ? ?Subjective:  ? ? Patient ID: [name], male    DOB: Oct 30, 1975, 46 y.o.   MRN: [id number] ? ?HPI: ?[name] is a 46 y.o. male presenting on 10/05/2021 for comprehensive medical examination. Current medical complaints include:none ? ?He currently lives with: ?Interim Problems from his last visit: no ? ?HYPERTENSION / HYPERLIPIDEMIA ?Satisfied with current treatment? yes ?Duration of hypertension: years ?BP monitoring frequency: not checking ?BP range:  ?BP medication side effects: no ?Past BP meds: amlodipine ?Duration of hyperlipidemia: years ?Cholesterol medication side effects: no ?Cholesterol supplements: none ?Past cholesterol medications: none ?Medication compliance: poor compliance ?Aspirin: no ?Recent stressors: no ?Recurrent headaches: no ?Visual changes: no ?Palpitations: no ?Dyspnea: no ?Chest pain: no ?Lower extremity edema: no ?Dizzy/lightheaded: no ? ?Depression Screen done today and results listed below:  ? ?  10/05/2021  ? 10:44 AM 06/18/2020  ?  8:24 AM 05/14/2019  ?  8:10 AM 04/23/2019  ?  9:42 AM 05/12/2017  ? 10:07 AM  ?Depression screen PHQ 2/9  ?Decreased Interest 0 0 0 0 0  ?Down, Depressed, Hopeless 0 0 0 0 0  ?PHQ - 2 Score 0 0 0 0 0  ?Altered sleeping 0      ?Tired, decreased energy 0      ?Change in appetite 0      ?Feeling bad or failure about yourself  0      ?Trouble concentrating 0      ?Moving slowly or fidgety/restless 0      ?Suicidal thoughts 0      ?PHQ-9 Score 0      ?Difficult doing work/chores Not difficult at all      ? ? ?The patient does not have a history of falls. I did complete a risk assessment for falls. A plan of care for falls was documented. ? ? ?Past Medical History:  ?Past Medical History:  ?Diagnosis Date  ? Allergy   ? Essential hypertension   ? Obesity   ? ? ?Surgical History:  ?Past Surgical History:   ?Procedure Laterality Date  ? APPENDECTOMY    ? BACK SURGERY    ? HERNIA REPAIR    ? SPINE SURGERY    ? TONSILLECTOMY    ? TUMOR REMOVAL  2001  ? benign mass removed from behind left ear  ? VARICOSE VEIN SURGERY Bilateral 04/12/2019  ? ? ?Medications:  ?Current Outpatient Medications on File Prior to Visit  ?Medication Sig  ? ibuprofen (ADVIL,MOTRIN) 200 MG tablet Take 200 mg every 6 (six) hours as needed by mouth.  ? ?No current facility-administered medications on file prior to visit.  ? ? ?Allergies:  ?Allergies  ?Allergen Reactions  ? Erythromycin Anaphylaxis  ? Penicillins Anaphylaxis  ? Sulfa Antibiotics Anaphylaxis  ? ? ?Social History:  ?Social History  ? ?Socioeconomic History  ? Marital status: Married  ?  Spouse name: Not on file  ? Number of children: Not on file  ? Years of education: Not on file  ? Highest education level: Not on file  ?Occupational History  ? Not on file  ?Tobacco Use  ? Smoking status: Every Day  ?  Packs/day: 1.00  ?  Years: 20.00  ?  Pack years: 20.00  ?  Types: Cigarettes  ?  Smokeless tobacco: Never  ?Vaping Use  ? Vaping Use: Never used  ?Substance and Sexual Activity  ? Alcohol use: Yes  ?  Alcohol/week: 5.0 - 6.0 standard drinks  ?  Types: 5 - 6 Shots of liquor per week  ? Drug use: No  ? Sexual activity: Yes  ?  Birth control/protection: None  ?Other Topics Concern  ? Not on file  ?Social History Narrative  ? Not on file  ? ?Social Determinants of Health  ? ?Financial Resource Strain: Not on file  ?Food Insecurity: Not on file  ?Transportation Needs: Not on file  ?Physical Activity: Not on file  ?Stress: Not on file  ?Social Connections: Not on file  ?Intimate Partner Violence: Not on file  ? ?Social History  ? ?Tobacco Use  ?Smoking Status Every Day  ? Packs/day: 1.00  ? Years: 20.00  ? Pack years: 20.00  ? Types: Cigarettes  ?Smokeless Tobacco Never  ? ?Social History  ? ?Substance and Sexual Activity  ?Alcohol Use Yes  ? Alcohol/week: 5.0 - 6.0 standard drinks  ? Types: 5  - 6 Shots of liquor per week  ? ? ?Family History:  ?Family History  ?Problem Relation Age of Onset  ? Stroke Mother   ? Anxiety disorder Sister   ? Heart disease Paternal Grandfather   ? COPD Neg Hx   ? Diabetes Neg Hx   ? Hypertension Neg Hx   ? ? ?Past medical history, surgical history, medications, allergies, family history and social history reviewed with patient today and changes made to appropriate areas of the chart.  ? ?Review of Systems  ?Eyes:  Negative for blurred vision and double vision.  ?Respiratory:  Negative for shortness of breath.   ?Cardiovascular:  Negative for chest pain, palpitations and leg swelling.  ?Neurological:  Negative for dizziness and headaches.  ?All other ROS negative except what is listed above and in the HPI.  ? ?   ?Objective:  ?  ?BP 138/84   Pulse 78   Temp 98.9 ?F (37.2 ?C) (Oral)   Ht '6\' 3"'  (1.905 m)   Wt 277 lb 12.8 oz (126 kg)   SpO2 99%   BMI 34.72 kg/m?   ?Wt Readings from Last 3 Encounters:  ?10/05/21 277 lb 12.8 oz (126 kg)  ?03/25/21 281 lb 9.6 oz (127.7 kg)  ?06/18/20 276 lb 2 oz (125.2 kg)  ?  ?Physical Exam ?Vitals and nursing note reviewed.  ?Constitutional:   ?   General: He is not in acute distress. ?   Appearance: Normal appearance. He is not ill-appearing, toxic-appearing or diaphoretic.  ?HENT:  ?   Head: Normocephalic.  ?   Right Ear: Tympanic membrane, ear canal and external ear normal.  ?   Left Ear: Tympanic membrane, ear canal and external ear normal.  ?   Nose: Nose normal. No congestion or rhinorrhea.  ?   Mouth/Throat:  ?   Mouth: Mucous membranes are moist.  ?Eyes:  ?   General:     ?   Right eye: No discharge.     ?   Left eye: No discharge.  ?   Extraocular Movements: Extraocular movements intact.  ?   Conjunctiva/sclera: Conjunctivae normal.  ?   Pupils: Pupils are equal, round, and reactive to light.  ?Cardiovascular:  ?   Rate and Rhythm: Normal rate and regular rhythm.  ?   Heart sounds: No murmur heard. ?Pulmonary:  ?   Effort:  Pulmonary effort is normal.  No respiratory distress.  ?   Breath sounds: Normal breath sounds. No wheezing, rhonchi or rales.  ?Abdominal:  ?   General: Abdomen is flat. Bowel sounds are normal. There is no distension.  ?   Palpations: Abdomen is soft.  ?   Tenderness: There is no abdominal tenderness. There is no guarding.  ?Musculoskeletal:  ?   Cervical back: Normal range of motion and neck supple.  ?Skin: ?   General: Skin is warm and dry.  ?   Capillary Refill: Capillary refill takes less than 2 seconds.  ?Neurological:  ?   General: No focal deficit present.  ?   Mental Status: He is alert and oriented to person, place, and time.  ?   Cranial Nerves: No cranial nerve deficit.  ?   Motor: No weakness.  ?   Deep Tendon Reflexes: Reflexes normal.  ?Psychiatric:     ?   Mood and Affect: Mood normal.     ?   Behavior: Behavior normal.     ?   Thought Content: Thought content normal.     ?   Judgment: Judgment normal.  ? ? ?Results for orders placed or performed in visit on 03/25/21  ?Comp Met (CMET)  ?Result Value Ref Range  ? Glucose 92 65 - 99 mg/dL  ? BUN 12 6 - 24 mg/dL  ? Creatinine, Ser 0.75 (L) 0.76 - 1.27 mg/dL  ? eGFR 112 >59 mL/min/1.73  ? BUN/Creatinine Ratio 16 9 - 20  ? Sodium 145 (H) 134 - 144 mmol/L  ? Potassium 4.2 3.5 - 5.2 mmol/L  ? Chloride 106 96 - 106 mmol/L  ? CO2 22 20 - 29 mmol/L  ? Calcium 9.1 8.7 - 10.2 mg/dL  ? Total Protein 6.7 6.0 - 8.5 g/dL  ? Albumin 4.5 4.0 - 5.0 g/dL  ? Globulin, Total 2.2 1.5 - 4.5 g/dL  ? Albumin/Globulin Ratio 2.0 1.2 - 2.2  ? Bilirubin Total 0.3 0.0 - 1.2 mg/dL  ? Alkaline Phosphatase 70 44 - 121 IU/L  ? AST 52 (H) 0 - 40 IU/L  ? ALT 78 (H) 0 - 44 IU/L  ?Lipid Profile  ?Result Value Ref Range  ? Cholesterol, Total 190 100 - 199 mg/dL  ? Triglycerides 107 0 - 149 mg/dL  ? HDL 80 >39 mg/dL  ? VLDL Cholesterol Cal 19 5 - 40 mg/dL  ? LDL Chol Calc (NIH) 91 0 - 99 mg/dL  ? Chol/HDL Ratio 2.4 0.0 - 5.0 ratio  ? ?   ?Assessment & Plan:  ? ?Problem List Items Addressed  This Visit   ? ?  ? Cardiovascular and Mediastinum  ? Essential hypertension  ?  Chronic.  Controlled.  Continue with current medication regimen of Amlodipine 9m daily.  Refill sent today.  Labs ordered today.  Return to clin

## 2021-10-06 LAB — CBC WITH DIFFERENTIAL/PLATELET
Basophils Absolute: 0.1 10*3/uL (ref 0.0–0.2)
Basos: 1 %
EOS (ABSOLUTE): 0.2 10*3/uL (ref 0.0–0.4)
Eos: 3 %
Hematocrit: 51.3 % — ABNORMAL HIGH (ref 37.5–51.0)
Hemoglobin: 17.6 g/dL (ref 13.0–17.7)
Immature Grans (Abs): 0 10*3/uL (ref 0.0–0.1)
Immature Granulocytes: 0 %
Lymphocytes Absolute: 1.6 10*3/uL (ref 0.7–3.1)
Lymphs: 21 %
MCH: 33.2 pg — ABNORMAL HIGH (ref 26.6–33.0)
MCHC: 34.3 g/dL (ref 31.5–35.7)
MCV: 97 fL (ref 79–97)
Monocytes Absolute: 0.5 10*3/uL (ref 0.1–0.9)
Monocytes: 7 %
Neutrophils Absolute: 5.1 10*3/uL (ref 1.4–7.0)
Neutrophils: 68 %
Platelets: 174 10*3/uL (ref 150–450)
RBC: 5.3 x10E6/uL (ref 4.14–5.80)
RDW: 12.1 % (ref 11.6–15.4)
WBC: 7.5 10*3/uL (ref 3.4–10.8)

## 2021-10-06 LAB — LIPID PANEL
Chol/HDL Ratio: 2.6 ratio (ref 0.0–5.0)
Cholesterol, Total: 176 mg/dL (ref 100–199)
HDL: 67 mg/dL (ref >39)
LDL Chol Calc (NIH): 84 mg/dL (ref 0–99)
Triglycerides: 145 mg/dL (ref 0–149)
VLDL Cholesterol Cal: 25 mg/dL (ref 5–40)

## 2021-10-06 LAB — COMPREHENSIVE METABOLIC PANEL
ALT: 49 IU/L — ABNORMAL HIGH (ref 0–44)
AST: 32 IU/L (ref 0–40)
Albumin/Globulin Ratio: 2.4 — ABNORMAL HIGH (ref 1.2–2.2)
Albumin: 4.6 g/dL (ref 4.0–5.0)
Alkaline Phosphatase: 67 IU/L (ref 44–121)
BUN/Creatinine Ratio: 12 (ref 9–20)
BUN: 9 mg/dL (ref 6–24)
Bilirubin Total: 0.2 mg/dL (ref 0.0–1.2)
CO2: 24 mmol/L (ref 20–29)
Calcium: 9.5 mg/dL (ref 8.7–10.2)
Chloride: 107 mmol/L — ABNORMAL HIGH (ref 96–106)
Creatinine, Ser: 0.76 mg/dL (ref 0.76–1.27)
Globulin, Total: 1.9 g/dL (ref 1.5–4.5)
Glucose: 81 mg/dL (ref 70–99)
Potassium: 4.4 mmol/L (ref 3.5–5.2)
Sodium: 147 mmol/L — ABNORMAL HIGH (ref 134–144)
Total Protein: 6.5 g/dL (ref 6.0–8.5)
eGFR: 112 mL/min/{1.73_m2} (ref >59)

## 2021-10-06 LAB — TSH: TSH: 2.59 u[IU]/mL (ref 0.450–4.500)

## 2021-10-06 NOTE — Progress Notes (Signed)
Hi [name].  It was nice to see you yesterday.  Your lab work looks good.  Your liver enzymes improved from prior.  We will monitor this in the future.  No concerns at this time. Continue with your current medication regimen.  Follow up as discussed.  Please let me know if you have any questions.  ?

## 2022-04-06 NOTE — Progress Notes (Unsigned)
There were no vitals taken for this visit.   Subjective:    Patient ID: [name], male    DOB: [birth year], 46 y.o.   MRN: [id number]  HPI: [name] is a 46 y.o. male  No chief complaint on file.   HYPERTENSION / HYPERLIPIDEMIA Satisfied with current treatment? no Duration of hypertension: years BP monitoring frequency: not checking BP range:  BP medication side effects: no Past BP meds: amlodipine Duration of hyperlipidemia: years Cholesterol medication side effects: no Cholesterol supplements: none Past cholesterol medications: none Medication compliance: excellent compliance Aspirin: no Recent stressors: no Recurrent headaches: no Visual changes: no Palpitations: no Dyspnea: no Chest pain: no Lower extremity edema: no Dizzy/lightheaded: no  SMOKING CESSATION Smoking Status: current everyday smoker Smoking Amount: 1PPD  Smoking Onset:  Smoking Quit Date:  Smoking triggers: Type of tobacco use:  Pneumovax:    Relevant past medical, surgical, family and social history reviewed and updated as indicated. Interim medical history since our last visit reviewed. Allergies and medications reviewed and updated.  Review of Systems  Eyes:  Negative for visual disturbance.  Respiratory:  Negative for shortness of breath.   Cardiovascular:  Negative for chest pain and leg swelling.  Neurological:  Positive for dizziness. Negative for light-headedness and headaches.   Per HPI unless specifically indicated above     Objective:    There were no vitals taken for this visit.  Wt Readings from Last 3 Encounters:  10/05/21 277 lb 12.8 oz (126 kg)  03/25/21 281 lb 9.6 oz (127.7 kg)  06/18/20 276 lb 2 oz (125.2 kg)    Physical Exam Vitals and nursing note reviewed.  Constitutional:      General: He is not in acute distress.    Appearance: Normal appearance. He is not ill-appearing, toxic-appearing or diaphoretic.  HENT:     Head: Normocephalic.      Right Ear: External ear normal. A middle ear effusion is present.     Left Ear: External ear normal. A middle ear effusion is present.     Nose: Nose normal. No congestion or rhinorrhea.     Mouth/Throat:     Mouth: Mucous membranes are moist.  Eyes:     General:        Right eye: No discharge.        Left eye: No discharge.     Extraocular Movements: Extraocular movements intact.     Conjunctiva/sclera: Conjunctivae normal.     Pupils: Pupils are equal, round, and reactive to light.  Cardiovascular:     Rate and Rhythm: Normal rate and regular rhythm.     Heart sounds: No murmur heard. Pulmonary:     Effort: Pulmonary effort is normal. No respiratory distress.     Breath sounds: Normal breath sounds. No wheezing, rhonchi or rales.  Abdominal:     General: Abdomen is flat. Bowel sounds are normal.  Musculoskeletal:     Cervical back: Normal range of motion and neck supple.  Skin:    General: Skin is warm and dry.     Capillary Refill: Capillary refill takes less than 2 seconds.  Neurological:     General: No focal deficit present.     Mental Status: He is alert and oriented to person, place, and time.  Psychiatric:        Mood and Affect: Mood normal.        Behavior: Behavior normal.        Thought Content: Thought content  normal.        Judgment: Judgment normal.    Results for orders placed or performed in visit on 10/05/21  Microscopic Examination   Urine  Result Value Ref Range   WBC, UA None seen 0 - 5 /hpf   RBC, Urine 0-2 0 - 2 /hpf   Epithelial Cells (non renal) 0-10 0 - 10 /hpf   Crystals Present (A) N/A   Crystal Type Amorphous Sediment N/A   Mucus, UA Present (A) Not Estab.   Bacteria, UA None seen None seen/Few  TSH  Result Value Ref Range   TSH 2.590 0.450 - 4.500 uIU/mL  Lipid panel  Result Value Ref Range   Cholesterol, Total 176 100 - 199 mg/dL   Triglycerides 145 0 - 149 mg/dL   HDL 67 >39 mg/dL   VLDL Cholesterol Cal 25 5 - 40 mg/dL   LDL Chol  Calc (NIH) 84 0 - 99 mg/dL   Chol/HDL Ratio 2.6 0.0 - 5.0 ratio  CBC with Differential/Platelet  Result Value Ref Range   WBC 7.5 3.4 - 10.8 x10E3/uL   RBC 5.30 4.14 - 5.80 x10E6/uL   Hemoglobin 17.6 13.0 - 17.7 g/dL   Hematocrit 51.3 (H) 37.5 - 51.0 %   MCV 97 79 - 97 fL   MCH 33.2 (H) 26.6 - 33.0 pg   MCHC 34.3 31.5 - 35.7 g/dL   RDW 12.1 11.6 - 15.4 %   Platelets 174 150 - 450 x10E3/uL   Neutrophils 68 Not Estab. %   Lymphs 21 Not Estab. %   Monocytes 7 Not Estab. %   Eos 3 Not Estab. %   Basos 1 Not Estab. %   Neutrophils Absolute 5.1 1.4 - 7.0 x10E3/uL   Lymphocytes Absolute 1.6 0.7 - 3.1 x10E3/uL   Monocytes Absolute 0.5 0.1 - 0.9 x10E3/uL   EOS (ABSOLUTE) 0.2 0.0 - 0.4 x10E3/uL   Basophils Absolute 0.1 0.0 - 0.2 x10E3/uL   Immature Granulocytes 0 Not Estab. %   Immature Grans (Abs) 0.0 0.0 - 0.1 x10E3/uL  Comprehensive metabolic panel  Result Value Ref Range   Glucose 81 70 - 99 mg/dL   BUN 9 6 - 24 mg/dL   Creatinine, Ser 0.76 0.76 - 1.27 mg/dL   eGFR 112 >59 mL/min/1.73   BUN/Creatinine Ratio 12 9 - 20   Sodium 147 (H) 134 - 144 mmol/L   Potassium 4.4 3.5 - 5.2 mmol/L   Chloride 107 (H) 96 - 106 mmol/L   CO2 24 20 - 29 mmol/L   Calcium 9.5 8.7 - 10.2 mg/dL   Total Protein 6.5 6.0 - 8.5 g/dL   Albumin 4.6 4.0 - 5.0 g/dL   Globulin, Total 1.9 1.5 - 4.5 g/dL   Albumin/Globulin Ratio 2.4 (H) 1.2 - 2.2   Bilirubin Total 0.2 0.0 - 1.2 mg/dL   Alkaline Phosphatase 67 44 - 121 IU/L   AST 32 0 - 40 IU/L   ALT 49 (H) 0 - 44 IU/L  Urinalysis, Routine w reflex microscopic  Result Value Ref Range   Specific Gravity, UA 1.020 1.005 - 1.030   pH, UA 7.5 5.0 - 7.5   Color, UA Yellow Yellow   Appearance Ur Clear Clear   Leukocytes,UA Negative Negative   Protein,UA Trace (A) Negative/Trace   Glucose, UA Negative Negative   Ketones, UA Negative Negative   RBC, UA Trace (A) Negative   Bilirubin, UA Negative Negative   Urobilinogen, Ur 0.2 0.2 - 1.0 mg/dL   Nitrite,  UA  Negative Negative   Microscopic Examination See below:       Assessment & Plan:   Problem List Items Addressed This Visit      Cardiovascular and Mediastinum   Essential hypertension - Primary     Other   Tobacco use   Elevated LDL cholesterol level     Follow up plan: No follow-ups on file.

## 2022-04-07 ENCOUNTER — Ambulatory Visit (INDEPENDENT_AMBULATORY_CARE_PROVIDER_SITE_OTHER): Payer: Self-pay | Admitting: Nurse Practitioner

## 2022-04-07 ENCOUNTER — Encounter: Payer: Self-pay | Admitting: Nurse Practitioner

## 2022-04-07 VITALS — BP 138/84 | HR 83 | Temp 97.7°F | Wt 289.5 lb

## 2022-04-07 DIAGNOSIS — Z720 Tobacco use: Secondary | ICD-10-CM

## 2022-04-07 DIAGNOSIS — E7800 Pure hypercholesterolemia, unspecified: Secondary | ICD-10-CM

## 2022-04-07 DIAGNOSIS — I10 Essential (primary) hypertension: Secondary | ICD-10-CM

## 2022-04-07 NOTE — Assessment & Plan Note (Signed)
Discussed options and readiness for tobacco cessation. Has cut back recently to 0.5-1 PPD from 1 PPD and less to 5 cigarettes/day while using nicorette, however increased frequency d/t stressors.  Pt states he will likely resume/try nicorette at a later time. He and his wife are both attempting to lessen their tobacco use.

## 2022-04-07 NOTE — Assessment & Plan Note (Signed)
No changes to current regimen at this time. Pt has deferred labs today, but will return in ~ 1 month for BMP to assess renal function.

## 2022-04-07 NOTE — Assessment & Plan Note (Signed)
Labs deferred today until patient obtains new insurance coverage.  Currently no prescriptions for HLD. Once labs resulted, will contact pt will results and further interventions as necessary. n

## 2022-06-05 ENCOUNTER — Other Ambulatory Visit: Payer: Self-pay | Admitting: Nurse Practitioner

## 2022-06-08 NOTE — Telephone Encounter (Signed)
Requested Prescriptions  Pending Prescriptions Disp Refills   amLODipine (NORVASC) 5 MG tablet [Pharmacy Med Name: AMLODIPINE BESYLATE 5 MG TAB] 90 tablet 1    Sig: TAKE 1 TABLET (5 MG TOTAL) BY MOUTH DAILY.     Cardiovascular: Calcium Channel Blockers 2 Passed - 06/05/2022  9:00 AM      Passed - Last BP in normal range    BP Readings from Last 1 Encounters:  04/07/22 138/84         Passed - Last Heart Rate in normal range    Pulse Readings from Last 1 Encounters:  04/07/22 83         Passed - Valid encounter within last 6 months    Recent Outpatient Visits           2 months ago Essential hypertension   [facility] Larae Grooms, NP   8 months ago Annual physical exam   [facility] Larae Grooms, NP   1 year ago Essential hypertension   [facility] Larae Grooms, NP   1 year ago Essential hypertension   [facility] Caro Laroche, DO   2 years ago Essential hypertension   [facility] Particia Nearing, [place]       Future Appointments             In 4 months Larae Grooms, NP [facility]

## 2022-10-05 NOTE — Progress Notes (Unsigned)
There were no vitals taken for this visit.   Subjective:    Patient ID: [name], male    DOB: Sep 12, 1975, 47 y.o.   MRN: [id number]  HPI: [name] is a 47 y.o. male presenting on 10/06/2022 for comprehensive medical examination. Current medical complaints include:none  He currently lives with: Interim Problems from his last visit: no  HYPERTENSION / HYPERLIPIDEMIA Satisfied with current treatment? yes Duration of hypertension: years BP monitoring frequency: not checking BP range:  BP medication side effects: no Past BP meds: amlodipine Duration of hyperlipidemia: years Cholesterol medication side effects: no Cholesterol supplements: none Past cholesterol medications: none Medication compliance: poor compliance Aspirin: no Recent stressors: no Recurrent headaches: no Visual changes: no Palpitations: no Dyspnea: no Chest pain: no Lower extremity edema: no Dizzy/lightheaded: no  Depression Screen done today and results listed below:     04/07/2022    8:25 AM 10/05/2021   10:44 AM 06/18/2020    8:24 AM 05/14/2019    8:10 AM 04/23/2019    9:42 AM  Depression screen PHQ 2/9  Decreased Interest 0 0 0 0 0  Down, Depressed, Hopeless 0 0 0 0 0  PHQ - 2 Score 0 0 0 0 0  Altered sleeping 0 0     Tired, decreased energy 0 0     Change in appetite 0 0     Feeling bad or failure about yourself  0 0     Trouble concentrating 0 0     Moving slowly or fidgety/restless 0 0     Suicidal thoughts 0 0     PHQ-9 Score 0 0     Difficult doing work/chores Not difficult at all Not difficult at all       The patient does not have a history of falls. I did complete a risk assessment for falls. A plan of care for falls was documented.   Past Medical History:  Past Medical History:  Diagnosis Date   Allergy    Essential hypertension    Obesity     Surgical History:  Past Surgical History:  Procedure Laterality Date   APPENDECTOMY     BACK SURGERY     HERNIA  REPAIR     SPINE SURGERY     TONSILLECTOMY     TUMOR REMOVAL  2001   benign mass removed from behind left ear   VARICOSE VEIN SURGERY Bilateral 04/12/2019    Medications:  Current Outpatient Medications on File Prior to Visit  Medication Sig   amLODipine (NORVASC) 5 MG tablet TAKE 1 TABLET (5 MG TOTAL) BY MOUTH DAILY.   ibuprofen (ADVIL,MOTRIN) 200 MG tablet Take 200 mg every 6 (six) hours as needed by mouth.   No current facility-administered medications on file prior to visit.    Allergies:  Allergies  Allergen Reactions   Erythromycin Anaphylaxis   Penicillins Anaphylaxis   Sulfa Antibiotics Anaphylaxis    Social History:  Social History   Socioeconomic History   Marital status: Married    Spouse name: Not on file   Number of children: Not on file   Years of education: Not on file   Highest education level: Not on file  Occupational History   Not on file  Tobacco Use   Smoking status: Every Day    Packs/day: 1.00    Years: 20.00    Additional pack years: 0.00    Total pack years: 20.00    Types: Cigarettes   Smokeless tobacco:  Never  Vaping Use   Vaping Use: Never used  Substance and Sexual Activity   Alcohol use: Yes    Alcohol/week: 5.0 - 6.0 standard drinks of alcohol    Types: 5 - 6 Shots of liquor per week   Drug use: No   Sexual activity: Yes    Birth control/protection: None  Other Topics Concern   Not on file  Social History Narrative   Not on file   Social Determinants of Health   Financial Resource Strain: Not on file  Food Insecurity: Not on file  Transportation Needs: Not on file  Physical Activity: Not on file  Stress: Not on file  Social Connections: Not on file  Intimate Partner Violence: Not on file   Social History   Tobacco Use  Smoking Status Every Day   Packs/day: 1.00   Years: 20.00   Additional pack years: 0.00   Total pack years: 20.00   Types: Cigarettes  Smokeless Tobacco Never   Social History   Substance  and Sexual Activity  Alcohol Use Yes   Alcohol/week: 5.0 - 6.0 standard drinks of alcohol   Types: 5 - 6 Shots of liquor per week    Family History:  Family History  Problem Relation Age of Onset   Stroke Mother    Anxiety disorder Sister    Heart disease Paternal Grandfather    COPD Neg Hx    Diabetes Neg Hx    Hypertension Neg Hx     Past medical history, surgical history, medications, allergies, family history and social history reviewed with patient today and changes made to appropriate areas of the chart.   Review of Systems  Eyes:  Negative for blurred vision and double vision.  Respiratory:  Negative for shortness of breath.   Cardiovascular:  Negative for chest pain, palpitations and leg swelling.  Neurological:  Negative for dizziness and headaches.   All other ROS negative except what is listed above and in the HPI.      Objective:    There were no vitals taken for this visit.  Wt Readings from Last 3 Encounters:  04/07/22 289 lb 8 oz (131.3 kg)  10/05/21 277 lb 12.8 oz (126 kg)  03/25/21 281 lb 9.6 oz (127.7 kg)    Physical Exam Vitals and nursing note reviewed.  Constitutional:      General: He is not in acute distress.    Appearance: Normal appearance. He is not ill-appearing, toxic-appearing or diaphoretic.  HENT:     Head: Normocephalic.     Right Ear: Tympanic membrane, ear canal and external ear normal.     Left Ear: Tympanic membrane, ear canal and external ear normal.     Nose: Nose normal. No congestion or rhinorrhea.     Mouth/Throat:     Mouth: Mucous membranes are moist.  Eyes:     General:        Right eye: No discharge.        Left eye: No discharge.     Extraocular Movements: Extraocular movements intact.     Conjunctiva/sclera: Conjunctivae normal.     Pupils: Pupils are equal, round, and reactive to light.  Cardiovascular:     Rate and Rhythm: Normal rate and regular rhythm.     Heart sounds: No murmur heard. Pulmonary:      Effort: Pulmonary effort is normal. No respiratory distress.     Breath sounds: Normal breath sounds. No wheezing, rhonchi or rales.  Abdominal:  General: Abdomen is flat. Bowel sounds are normal. There is no distension.     Palpations: Abdomen is soft.     Tenderness: There is no abdominal tenderness. There is no guarding.  Musculoskeletal:     Cervical back: Normal range of motion and neck supple.  Skin:    General: Skin is warm and dry.     Capillary Refill: Capillary refill takes less than 2 seconds.  Neurological:     General: No focal deficit present.     Mental Status: He is alert and oriented to person, place, and time.     Cranial Nerves: No cranial nerve deficit.     Motor: No weakness.     Deep Tendon Reflexes: Reflexes normal.  Psychiatric:        Mood and Affect: Mood normal.        Behavior: Behavior normal.        Thought Content: Thought content normal.        Judgment: Judgment normal.     Results for orders placed or performed in visit on 10/05/21  Microscopic Examination   Urine  Result Value Ref Range   WBC, UA None seen 0 - 5 /hpf   RBC, Urine 0-2 0 - 2 /hpf   Epithelial Cells (non renal) 0-10 0 - 10 /hpf   Crystals Present (A) N/A   Crystal Type Amorphous Sediment N/A   Mucus, UA Present (A) Not Estab.   Bacteria, UA None seen None seen/Few  TSH  Result Value Ref Range   TSH 2.590 0.450 - 4.500 uIU/mL  Lipid panel  Result Value Ref Range   Cholesterol, Total 176 100 - 199 mg/dL   Triglycerides 145 0 - 149 mg/dL   HDL 67 >39 mg/dL   VLDL Cholesterol Cal 25 5 - 40 mg/dL   LDL Chol Calc (NIH) 84 0 - 99 mg/dL   Chol/HDL Ratio 2.6 0.0 - 5.0 ratio  CBC with Differential/Platelet  Result Value Ref Range   WBC 7.5 3.4 - 10.8 x10E3/uL   RBC 5.30 4.14 - 5.80 x10E6/uL   Hemoglobin 17.6 13.0 - 17.7 g/dL   Hematocrit 51.3 (H) 37.5 - 51.0 %   MCV 97 79 - 97 fL   MCH 33.2 (H) 26.6 - 33.0 pg   MCHC 34.3 31.5 - 35.7 g/dL   RDW 12.1 11.6 - 15.4 %    Platelets 174 150 - 450 x10E3/uL   Neutrophils 68 Not Estab. %   Lymphs 21 Not Estab. %   Monocytes 7 Not Estab. %   Eos 3 Not Estab. %   Basos 1 Not Estab. %   Neutrophils Absolute 5.1 1.4 - 7.0 x10E3/uL   Lymphocytes Absolute 1.6 0.7 - 3.1 x10E3/uL   Monocytes Absolute 0.5 0.1 - 0.9 x10E3/uL   EOS (ABSOLUTE) 0.2 0.0 - 0.4 x10E3/uL   Basophils Absolute 0.1 0.0 - 0.2 x10E3/uL   Immature Granulocytes 0 Not Estab. %   Immature Grans (Abs) 0.0 0.0 - 0.1 x10E3/uL  Comprehensive metabolic panel  Result Value Ref Range   Glucose 81 70 - 99 mg/dL   BUN 9 6 - 24 mg/dL   Creatinine, Ser 0.76 0.76 - 1.27 mg/dL   eGFR 112 >59 mL/min/1.73   BUN/Creatinine Ratio 12 9 - 20   Sodium 147 (H) 134 - 144 mmol/L   Potassium 4.4 3.5 - 5.2 mmol/L   Chloride 107 (H) 96 - 106 mmol/L   CO2 24 20 - 29 mmol/L   Calcium 9.5  8.7 - 10.2 mg/dL   Total Protein 6.5 6.0 - 8.5 g/dL   Albumin 4.6 4.0 - 5.0 g/dL   Globulin, Total 1.9 1.5 - 4.5 g/dL   Albumin/Globulin Ratio 2.4 (H) 1.2 - 2.2   Bilirubin Total 0.2 0.0 - 1.2 mg/dL   Alkaline Phosphatase 67 44 - 121 IU/L   AST 32 0 - 40 IU/L   ALT 49 (H) 0 - 44 IU/L  Urinalysis, Routine w reflex microscopic  Result Value Ref Range   Specific Gravity, UA 1.020 1.005 - 1.030   pH, UA 7.5 5.0 - 7.5   Color, UA Yellow Yellow   Appearance Ur Clear Clear   Leukocytes,UA Negative Negative   Protein,UA Trace (A) Negative/Trace   Glucose, UA Negative Negative   Ketones, UA Negative Negative   RBC, UA Trace (A) Negative   Bilirubin, UA Negative Negative   Urobilinogen, Ur 0.2 0.2 - 1.0 mg/dL   Nitrite, UA Negative Negative   Microscopic Examination See below:       Assessment & Plan:   Problem List Items Addressed This Visit   None    Discussed aspirin prophylaxis for myocardial infarction prevention and decision was it was not indicated  LABORATORY TESTING:  Health maintenance labs ordered today as discussed above.    IMMUNIZATIONS:   - Tdap: Tetanus  vaccination status reviewed: last tetanus booster within 10 years. - Influenza: Refused - Pneumovax: Administered today - Prevnar: Not applicable - COVID: Up to date - HPV: Not applicable - Shingrix vaccine: Not applicable  SCREENING: - Colonoscopy: Ordered today  Discussed with patient purpose of the colonoscopy is to detect colon cancer at curable precancerous or early stages   - AAA Screening: Not applicable  -Hearing Test: Not applicable  -Spirometry: Not applicable   PATIENT COUNSELING:    Sexuality: Discussed sexually transmitted diseases, partner selection, use of condoms, avoidance of unintended pregnancy  and contraceptive alternatives.   Advised to avoid cigarette smoking.  I discussed with the patient that most people either abstain from alcohol or drink within safe limits (<=14/week and <=4 drinks/occasion for males, <=7/weeks and <= 3 drinks/occasion for females) and that the risk for alcohol disorders and other health effects rises proportionally with the number of drinks per week and how often a drinker exceeds daily limits.  Discussed cessation/primary prevention of drug use and availability of treatment for abuse.   Diet: Encouraged to adjust caloric intake to maintain  or achieve ideal body weight, to reduce intake of dietary saturated fat and total fat, to limit sodium intake by avoiding high sodium foods and not adding table salt, and to maintain adequate dietary potassium and calcium preferably from fresh fruits, vegetables, and low-fat dairy products.    stressed the importance of regular exercise  Injury prevention: Discussed safety belts, safety helmets, smoke detector, smoking near bedding or upholstery.   Dental health: Discussed importance of regular tooth brushing, flossing, and dental visits.   Follow up plan: NEXT PREVENTATIVE PHYSICAL DUE IN 1 YEAR. No follow-ups on file.

## 2022-10-06 ENCOUNTER — Ambulatory Visit (INDEPENDENT_AMBULATORY_CARE_PROVIDER_SITE_OTHER): Payer: Self-pay | Admitting: Nurse Practitioner

## 2022-10-06 ENCOUNTER — Encounter: Payer: Self-pay | Admitting: Nurse Practitioner

## 2022-10-06 VITALS — BP 133/87 | HR 101 | Temp 98.3°F | Ht 75.2 in | Wt 281.1 lb

## 2022-10-06 DIAGNOSIS — E7800 Pure hypercholesterolemia, unspecified: Secondary | ICD-10-CM

## 2022-10-06 DIAGNOSIS — I10 Essential (primary) hypertension: Secondary | ICD-10-CM

## 2022-10-06 DIAGNOSIS — Z0000 Encounter for general adult medical examination without abnormal findings: Secondary | ICD-10-CM

## 2022-10-06 DIAGNOSIS — Z720 Tobacco use: Secondary | ICD-10-CM

## 2022-10-06 MED ORDER — AMLODIPINE BESYLATE 5 MG PO TABS: 5.0000 mg | ORAL_TABLET | Freq: Every day | ORAL | 1 refills | Status: DC

## 2022-10-06 NOTE — Assessment & Plan Note (Signed)
Chronic.  Controlled.  Continue with current medication regimen of Amlodipine 5mg daily.  Refills sent today. Labs ordered today.  Return to clinic in 6 months for reevaluation.  Call sooner if concerns arise.  ° °

## 2022-10-06 NOTE — Progress Notes (Signed)
BP 133/87   Pulse (!) 101   Temp 98.3 F (36.8 C) (Oral)   Ht 6' 3.2" (1.91 m)   Wt 281 lb 1.6 oz (127.5 kg)   SpO2 97%   BMI 34.95 kg/m    Subjective:    Patient ID: [name], male    DOB: 05/13/1976, 47 y.o.   MRN: [id number]  HPI: [name] is a 47 y.o. male  Chief Complaint  Patient presents with   Hypertension   HYPERTENSION without Chronic Kidney Disease Hypertension status: controlled  Satisfied with current treatment? yes Duration of hypertension: years BP monitoring frequency:   occasional  BP range:  BP medication side effects:  no Medication compliance: excellent compliance Previous BP meds:amlodipine Aspirin: no Recurrent headaches: no Visual changes: no Palpitations: no Dyspnea: no Chest pain: no Lower extremity edema: no Dizzy/lightheaded: no  Relevant past medical, surgical, family and social history reviewed and updated as indicated. Interim medical history since our last visit reviewed. Allergies and medications reviewed and updated.  Review of Systems  Eyes:  Negative for visual disturbance.  Respiratory:  Negative for chest tightness and shortness of breath.   Cardiovascular:  Negative for chest pain, palpitations and leg swelling.  Endocrine: Negative for polydipsia and polyuria.  Neurological:  Negative for dizziness, light-headedness, numbness and headaches.    Per HPI unless specifically indicated above     Objective:    BP 133/87   Pulse (!) 101   Temp 98.3 F (36.8 C) (Oral)   Ht 6' 3.2" (1.91 m)   Wt 281 lb 1.6 oz (127.5 kg)   SpO2 97%   BMI 34.95 kg/m   Wt Readings from Last 3 Encounters:  10/06/22 281 lb 1.6 oz (127.5 kg)  04/07/22 289 lb 8 oz (131.3 kg)  10/05/21 277 lb 12.8 oz (126 kg)    Physical Exam Vitals and nursing note reviewed.  Constitutional:      General: He is not in acute distress.    Appearance: Normal appearance. He is not ill-appearing, toxic-appearing or diaphoretic.  HENT:      Head: Normocephalic.     Right Ear: External ear normal.     Left Ear: External ear normal.     Nose: Nose normal. No congestion or rhinorrhea.     Mouth/Throat:     Mouth: Mucous membranes are moist.  Eyes:     General:        Right eye: No discharge.        Left eye: No discharge.     Extraocular Movements: Extraocular movements intact.     Conjunctiva/sclera: Conjunctivae normal.     Pupils: Pupils are equal, round, and reactive to light.  Cardiovascular:     Rate and Rhythm: Normal rate and regular rhythm.     Heart sounds: No murmur heard. Pulmonary:     Effort: Pulmonary effort is normal. No respiratory distress.     Breath sounds: Normal breath sounds. No wheezing, rhonchi or rales.  Abdominal:     General: Abdomen is flat. Bowel sounds are normal.  Musculoskeletal:     Cervical back: Normal range of motion and neck supple.  Skin:    General: Skin is warm and dry.     Capillary Refill: Capillary refill takes less than 2 seconds.  Neurological:     General: No focal deficit present.     Mental Status: He is alert and oriented to person, place, and time.  Psychiatric:  Mood and Affect: Mood normal.        Behavior: Behavior normal.        Thought Content: Thought content normal.        Judgment: Judgment normal.     Results for orders placed or performed in visit on 10/05/21  Microscopic Examination   Urine  Result Value Ref Range   WBC, UA None seen 0 - 5 /hpf   RBC, Urine 0-2 0 - 2 /hpf   Epithelial Cells (non renal) 0-10 0 - 10 /hpf   Crystals Present (A) N/A   Crystal Type Amorphous Sediment N/A   Mucus, UA Present (A) Not Estab.   Bacteria, UA None seen None seen/Few  TSH  Result Value Ref Range   TSH 2.590 0.450 - 4.500 uIU/mL  Lipid panel  Result Value Ref Range   Cholesterol, Total 176 100 - 199 mg/dL   Triglycerides 145 0 - 149 mg/dL   HDL 67 >39 mg/dL   VLDL Cholesterol Cal 25 5 - 40 mg/dL   LDL Chol Calc (NIH) 84 0 - 99 mg/dL   Chol/HDL  Ratio 2.6 0.0 - 5.0 ratio  CBC with Differential/Platelet  Result Value Ref Range   WBC 7.5 3.4 - 10.8 x10E3/uL   RBC 5.30 4.14 - 5.80 x10E6/uL   Hemoglobin 17.6 13.0 - 17.7 g/dL   Hematocrit 51.3 (H) 37.5 - 51.0 %   MCV 97 79 - 97 fL   MCH 33.2 (H) 26.6 - 33.0 pg   MCHC 34.3 31.5 - 35.7 g/dL   RDW 12.1 11.6 - 15.4 %   Platelets 174 150 - 450 x10E3/uL   Neutrophils 68 Not Estab. %   Lymphs 21 Not Estab. %   Monocytes 7 Not Estab. %   Eos 3 Not Estab. %   Basos 1 Not Estab. %   Neutrophils Absolute 5.1 1.4 - 7.0 x10E3/uL   Lymphocytes Absolute 1.6 0.7 - 3.1 x10E3/uL   Monocytes Absolute 0.5 0.1 - 0.9 x10E3/uL   EOS (ABSOLUTE) 0.2 0.0 - 0.4 x10E3/uL   Basophils Absolute 0.1 0.0 - 0.2 x10E3/uL   Immature Granulocytes 0 Not Estab. %   Immature Grans (Abs) 0.0 0.0 - 0.1 x10E3/uL  Comprehensive metabolic panel  Result Value Ref Range   Glucose 81 70 - 99 mg/dL   BUN 9 6 - 24 mg/dL   Creatinine, Ser 0.76 0.76 - 1.27 mg/dL   eGFR 112 >59 mL/min/1.73   BUN/Creatinine Ratio 12 9 - 20   Sodium 147 (H) 134 - 144 mmol/L   Potassium 4.4 3.5 - 5.2 mmol/L   Chloride 107 (H) 96 - 106 mmol/L   CO2 24 20 - 29 mmol/L   Calcium 9.5 8.7 - 10.2 mg/dL   Total Protein 6.5 6.0 - 8.5 g/dL   Albumin 4.6 4.0 - 5.0 g/dL   Globulin, Total 1.9 1.5 - 4.5 g/dL   Albumin/Globulin Ratio 2.4 (H) 1.2 - 2.2   Bilirubin Total 0.2 0.0 - 1.2 mg/dL   Alkaline Phosphatase 67 44 - 121 IU/L   AST 32 0 - 40 IU/L   ALT 49 (H) 0 - 44 IU/L  Urinalysis, Routine w reflex microscopic  Result Value Ref Range   Specific Gravity, UA 1.020 1.005 - 1.030   pH, UA 7.5 5.0 - 7.5   Color, UA Yellow Yellow   Appearance Ur Clear Clear   Leukocytes,UA Negative Negative   Protein,UA Trace (A) Negative/Trace   Glucose, UA Negative Negative   Ketones, UA  Negative Negative   RBC, UA Trace (A) Negative   Bilirubin, UA Negative Negative   Urobilinogen, Ur 0.2 0.2 - 1.0 mg/dL   Nitrite, UA Negative Negative   Microscopic  Examination See below:       Assessment & Plan:   Problem List Items Addressed This Visit       Cardiovascular and Mediastinum   Essential hypertension    Chronic.  Controlled.  Continue with current medication regimen of Amlodipine 5mg  daily.  Refills sent today.  Labs ordered today.  Return to clinic in 6 months for reevaluation.  Call sooner if concerns arise.        Relevant Medications   amLODipine (NORVASC) 5 MG tablet     Other   Elevated LDL cholesterol level    Labs ordered at visit today.  Will make recommendations based on lab results.        Relevant Orders   Lipid panel   Other Visit Diagnoses     Annual physical exam    -  Primary   Relevant Orders   Lipid panel   Comprehensive metabolic panel        Follow up plan: Return in about 6 months (around 04/08/2023) for HTN, HLD, DM2 FU.

## 2022-10-06 NOTE — Assessment & Plan Note (Signed)
Labs ordered at visit today.  Will make recommendations based on lab results.   

## 2022-10-07 LAB — LIPID PANEL
Chol/HDL Ratio: 2.8 ratio (ref 0.0–5.0)
Cholesterol, Total: 206 mg/dL — ABNORMAL HIGH (ref 100–199)
HDL: 73 mg/dL (ref >39)
LDL Chol Calc (NIH): 112 mg/dL — ABNORMAL HIGH (ref 0–99)
Triglycerides: 119 mg/dL (ref 0–149)
VLDL Cholesterol Cal: 21 mg/dL (ref 5–40)

## 2022-10-07 LAB — COMPREHENSIVE METABOLIC PANEL
ALT: 165 IU/L — ABNORMAL HIGH (ref 0–44)
AST: 160 IU/L — ABNORMAL HIGH (ref 0–40)
Albumin/Globulin Ratio: 1.7 (ref 1.2–2.2)
Albumin: 4.3 g/dL (ref 4.1–5.1)
Alkaline Phosphatase: 72 IU/L (ref 44–121)
BUN/Creatinine Ratio: 12 (ref 9–20)
BUN: 9 mg/dL (ref 6–24)
Bilirubin Total: 0.3 mg/dL (ref 0.0–1.2)
CO2: 20 mmol/L (ref 20–29)
Calcium: 9.2 mg/dL (ref 8.7–10.2)
Chloride: 104 mmol/L (ref 96–106)
Creatinine, Ser: 0.76 mg/dL (ref 0.76–1.27)
Globulin, Total: 2.6 g/dL (ref 1.5–4.5)
Glucose: 88 mg/dL (ref 70–99)
Potassium: 4 mmol/L (ref 3.5–5.2)
Sodium: 142 mmol/L (ref 134–144)
Total Protein: 6.9 g/dL (ref 6.0–8.5)
eGFR: 111 mL/min/{1.73_m2} (ref >59)

## 2022-10-07 NOTE — Progress Notes (Signed)
Please let patient know that his lab work looks good.  Cholesterol is elevated from prior.  I recommend a low fat diet and exercise.   His liver enzymes are elevated also.  I recommend an abdominal Ultrasound to evaluate this further.  If he agrees, I will order this.

## 2023-04-14 ENCOUNTER — Ambulatory Visit: Payer: Self-pay | Admitting: Nurse Practitioner

## 2023-04-17 ENCOUNTER — Other Ambulatory Visit: Payer: Self-pay | Admitting: Nurse Practitioner

## 2023-04-18 NOTE — Telephone Encounter (Signed)
Requested Prescriptions  Pending Prescriptions Disp Refills   amLODipine (NORVASC) 5 MG tablet [Pharmacy Med Name: AMLODIPINE BESYLATE 5 MG TAB] 90 tablet 0    Sig: TAKE 1 TABLET (5 MG TOTAL) BY MOUTH DAILY.     Cardiovascular: Calcium Channel Blockers 2 Failed - 04/17/2023  9:49 AM      Failed - Valid encounter within last 6 months    Recent Outpatient Visits           6 months ago Annual physical exam   [facility] Larae Grooms, NP   1 year ago Essential hypertension   [place] [facility] Larae Grooms, NP   1 year ago Annual physical exam   [place] [facility] Larae Grooms, NP   2 years ago Essential hypertension   [place] [facility] Larae Grooms, NP   2 years ago Essential hypertension   [place] [facility] Caro Laroche, DO       Future Appointments             In 1 week Larae Grooms, NP [place] [facility]            Passed - Last BP in normal range    BP Readings from Last 1 Encounters:  10/06/22 133/87         Passed - Last Heart Rate in normal range    Pulse Readings from Last 1 Encounters:  10/06/22 (!) 101

## 2023-04-28 ENCOUNTER — Ambulatory Visit (INDEPENDENT_AMBULATORY_CARE_PROVIDER_SITE_OTHER): Payer: 59 | Admitting: Nurse Practitioner

## 2023-04-28 ENCOUNTER — Encounter: Payer: Self-pay | Admitting: Nurse Practitioner

## 2023-04-28 VITALS — BP 117/75 | HR 114 | Temp 98.7°F | Ht 75.0 in | Wt 274.6 lb

## 2023-04-28 DIAGNOSIS — E7800 Pure hypercholesterolemia, unspecified: Secondary | ICD-10-CM | POA: Diagnosis not present

## 2023-04-28 DIAGNOSIS — I10 Essential (primary) hypertension: Secondary | ICD-10-CM | POA: Diagnosis not present

## 2023-04-28 DIAGNOSIS — L309 Dermatitis, unspecified: Secondary | ICD-10-CM | POA: Diagnosis not present

## 2023-04-28 MED ORDER — AMLODIPINE BESYLATE 5 MG PO TABS: 5.0000 mg | ORAL_TABLET | Freq: Every day | ORAL | 1 refills | Status: DC

## 2023-04-28 MED ORDER — MOMETASONE FUROATE 0.1 % EX CREA: TOPICAL_CREAM | CUTANEOUS | 1 refills | Status: AC

## 2023-04-28 NOTE — Progress Notes (Signed)
BP 117/75   Pulse (!) 114   Temp 98.7 F (37.1 C) (Oral)   Ht 6\' 3"  (1.905 m)   Wt 274 lb 9.6 oz (124.6 kg)   SpO2 98%   BMI 34.32 kg/m    Subjective:    Patient ID: [name], male    DOB: 10-19-1975, 47 y.o.   MRN: [id number]  HPI: [name] is a 47 y.o. male  Chief Complaint  Patient presents with   Hyperlipidemia   Hypertension   HYPERTENSION / HYPERLIPIDEMIA Patient states he has switched to celtic sea salt and it has shown an improvement in his blood pressure.   Satisfied with current treatment? yes Duration of hypertension: years BP monitoring frequency: not checking BP range:  BP medication side effects: no Past BP meds: amlodipine Duration of hyperlipidemia: years Cholesterol medication side effects: no Cholesterol supplements: none Past cholesterol medications: none Medication compliance: excellent compliance Aspirin: no Recent stressors: no Recurrent headaches: no Visual changes: no Palpitations: no Dyspnea: no Chest pain: no Lower extremity edema: no Dizzy/lightheaded: no Patient states he is still smoking about 1ppd.  He was using nicoderm.     Patient states he has eczema in his hairline, ears, and eyebrows.  He has been using mometasome cream and that has been helping his symptoms.    Relevant past medical, surgical, family and social history reviewed and updated as indicated. Interim medical history since our last visit reviewed. Allergies and medications reviewed and updated.  Review of Systems  Eyes:  Negative for visual disturbance.  Respiratory:  Negative for chest tightness and shortness of breath.   Cardiovascular:  Negative for chest pain, palpitations and leg swelling.  Skin:  Positive for rash.  Neurological:  Negative for dizziness, light-headedness and headaches.    Per HPI unless specifically indicated above     Objective:    BP 117/75   Pulse (!) 114   Temp 98.7 F (37.1 C) (Oral)   Ht 6\' 3"  (1.905 m)    Wt 274 lb 9.6 oz (124.6 kg)   SpO2 98%   BMI 34.32 kg/m   Wt Readings from Last 3 Encounters:  04/28/23 274 lb 9.6 oz (124.6 kg)  10/06/22 281 lb 1.6 oz (127.5 kg)  04/07/22 289 lb 8 oz (131.3 kg)    Physical Exam Vitals and nursing note reviewed.  Constitutional:      General: He is not in acute distress.    Appearance: Normal appearance. He is not ill-appearing, toxic-appearing or diaphoretic.  HENT:     Head: Normocephalic.     Right Ear: External ear normal.     Left Ear: External ear normal.     Nose: Nose normal. No congestion or rhinorrhea.     Mouth/Throat:     Mouth: Mucous membranes are moist.  Eyes:     General:        Right eye: No discharge.        Left eye: No discharge.     Extraocular Movements: Extraocular movements intact.     Conjunctiva/sclera: Conjunctivae normal.     Pupils: Pupils are equal, round, and reactive to light.  Cardiovascular:     Rate and Rhythm: Normal rate and regular rhythm.     Heart sounds: No murmur heard. Pulmonary:     Effort: Pulmonary effort is normal. No respiratory distress.     Breath sounds: Normal breath sounds. No wheezing, rhonchi or rales.  Abdominal:     General: Abdomen is  flat. Bowel sounds are normal.  Musculoskeletal:     Cervical back: Normal range of motion and neck supple.  Skin:    General: Skin is warm and dry.     Capillary Refill: Capillary refill takes less than 2 seconds.  Neurological:     General: No focal deficit present.     Mental Status: He is alert and oriented to person, place, and time.  Psychiatric:        Mood and Affect: Mood normal.        Behavior: Behavior normal.        Thought Content: Thought content normal.        Judgment: Judgment normal.     Results for orders placed or performed in visit on 10/06/22  Lipid panel  Result Value Ref Range   Cholesterol, Total 206 (H) 100 - 199 mg/dL   Triglycerides 161 0 - 149 mg/dL   HDL 73 >09 mg/dL   VLDL Cholesterol Cal 21 5 - 40  mg/dL   LDL Chol Calc (NIH) 604 (H) 0 - 99 mg/dL   Chol/HDL Ratio 2.8 0.0 - 5.0 ratio  Comprehensive metabolic panel  Result Value Ref Range   Glucose 88 70 - 99 mg/dL   BUN 9 6 - 24 mg/dL   Creatinine, Ser 5.40 0.76 - 1.27 mg/dL   eGFR 981 >19 JY/NWG/9.56   BUN/Creatinine Ratio 12 9 - 20   Sodium 142 134 - 144 mmol/L   Potassium 4.0 3.5 - 5.2 mmol/L   Chloride 104 96 - 106 mmol/L   CO2 20 20 - 29 mmol/L   Calcium 9.2 8.7 - 10.2 mg/dL   Total Protein 6.9 6.0 - 8.5 g/dL   Albumin 4.3 4.1 - 5.1 g/dL   Globulin, Total 2.6 1.5 - 4.5 g/dL   Albumin/Globulin Ratio 1.7 1.2 - 2.2   Bilirubin Total 0.3 0.0 - 1.2 mg/dL   Alkaline Phosphatase 72 44 - 121 IU/L   AST 160 (H) 0 - 40 IU/L   ALT 165 (H) 0 - 44 IU/L      Assessment & Plan:   Problem List Items Addressed This Visit       Cardiovascular and Mediastinum   Essential hypertension - Primary    Chronic.  Controlled.  Continue with current medication regimen of Amlodipine 5mg  daily.  Refills sent today.  Labs ordered today.  Return to clinic in 6 months for reevaluation.  Call sooner if concerns arise.        Relevant Orders   Comp Met (CMET)     Musculoskeletal and Integument   Eczema    Chronic. Well controlled with Mometasone cream.  Refilled today.          Other   Elevated LDL cholesterol level    Labs ordered at visit today.  Will make recommendations based on lab results.        Relevant Orders   Lipid Profile     Follow up plan: Return in about 6 months (around 10/27/2023) for Physical and Fasting labs.

## 2023-04-28 NOTE — Assessment & Plan Note (Signed)
Chronic. Well controlled with Mometasone cream.  Refilled today.

## 2023-04-28 NOTE — Assessment & Plan Note (Signed)
Chronic.  Controlled.  Continue with current medication regimen of Amlodipine 5mg  daily.  Refills sent today. Labs ordered today.  Return to clinic in 6 months for reevaluation.  Call sooner if concerns arise.

## 2023-04-28 NOTE — Assessment & Plan Note (Signed)
Labs ordered at visit today.  Will make recommendations based on lab results.

## 2023-04-29 LAB — COMPREHENSIVE METABOLIC PANEL
ALT: 198 [IU]/L — ABNORMAL HIGH (ref 0–44)
AST: 236 [IU]/L — ABNORMAL HIGH (ref 0–40)
Albumin: 4.6 g/dL (ref 4.1–5.1)
Alkaline Phosphatase: 96 [IU]/L (ref 44–121)
BUN/Creatinine Ratio: 12 (ref 9–20)
BUN: 8 mg/dL (ref 6–24)
Bilirubin Total: 0.5 mg/dL (ref 0.0–1.2)
CO2: 21 mmol/L (ref 20–29)
Calcium: 9.6 mg/dL (ref 8.7–10.2)
Chloride: 103 mmol/L (ref 96–106)
Creatinine, Ser: 0.66 mg/dL — ABNORMAL LOW (ref 0.76–1.27)
Globulin, Total: 2.5 g/dL (ref 1.5–4.5)
Glucose: 105 mg/dL — ABNORMAL HIGH (ref 70–99)
Potassium: 3.8 mmol/L (ref 3.5–5.2)
Sodium: 144 mmol/L (ref 134–144)
Total Protein: 7.1 g/dL (ref 6.0–8.5)
eGFR: 115 mL/min/{1.73_m2} (ref >59)

## 2023-04-29 LAB — LIPID PANEL
Chol/HDL Ratio: 3 {ratio} (ref 0.0–5.0)
Cholesterol, Total: 203 mg/dL — ABNORMAL HIGH (ref 100–199)
HDL: 68 mg/dL (ref >39)
LDL Chol Calc (NIH): 115 mg/dL — ABNORMAL HIGH (ref 0–99)
Triglycerides: 115 mg/dL (ref 0–149)
VLDL Cholesterol Cal: 20 mg/dL (ref 5–40)

## 2023-04-29 NOTE — Progress Notes (Signed)
Hi [name]. It was nice to see you yesterday.  Your lab work looks good.  Your liver enzymes increased from prior.  I recommend we obtain an ultrasound of the liver for further evaluation.  If you agree, I will order the imaging.  No concerns at this time. Continue with your current medication regimen.  Follow up as discussed.  Please let me know if you have any questions.

## 2023-10-27 ENCOUNTER — Ambulatory Visit: Payer: Self-pay | Admitting: Nurse Practitioner

## 2023-10-27 ENCOUNTER — Encounter: Payer: Self-pay | Admitting: Nurse Practitioner

## 2023-10-27 VITALS — BP 116/78 | HR 87 | Temp 98.4°F | Resp 17 | Ht 75.12 in | Wt 313.8 lb

## 2023-10-27 DIAGNOSIS — Z0000 Encounter for general adult medical examination without abnormal findings: Secondary | ICD-10-CM

## 2023-10-27 DIAGNOSIS — I10 Essential (primary) hypertension: Secondary | ICD-10-CM | POA: Diagnosis not present

## 2023-10-27 DIAGNOSIS — E7800 Pure hypercholesterolemia, unspecified: Secondary | ICD-10-CM | POA: Diagnosis not present

## 2023-10-27 NOTE — Assessment & Plan Note (Signed)
 Labs ordered at visit today.  Will make recommendations based on lab results.

## 2023-10-27 NOTE — Assessment & Plan Note (Signed)
Chronic.  Controlled.  Continue with current medication regimen of Amlodipine 5mg.  Refills sent today.  Labs ordered today.  Return to clinic in 6 months for reevaluation.  Call sooner if concerns arise.   

## 2023-10-27 NOTE — Progress Notes (Signed)
 BP 116/78 (BP Location: Left Arm, Patient Position: Sitting, Cuff Size: Large)   Pulse 87   Temp 98.4 F (36.9 C) (Oral)   Resp 17   Ht 6' 3.12" (1.908 m)   Wt (!) 313 lb 12.8 oz (142.3 kg)   SpO2 99%   BMI 39.10 kg/m    Subjective:    Patient ID: [name], male    DOB: 1976/02/14, 48 y.o.   MRN: [id number]  HPI: [name] is a 48 y.o. male presenting on 10/27/2023 for comprehensive medical examination. Current medical complaints include:none  He currently lives with: Interim Problems from his last visit: no  HYPERTENSION without Chronic Kidney Disease Hypertension status: controlled  Satisfied with current treatment? no Duration of hypertension: years BP monitoring frequency:  not checking BP range:  BP medication side effects:  no Medication compliance: excellent compliance Previous BP meds:amlodipine Aspirin: no Recurrent headaches: no Visual changes: no Palpitations: no Dyspnea: no Chest pain: no Lower extremity edema: no Dizzy/lightheaded: no   Depression Screen done today and results listed below:     04/28/2023    2:11 PM 10/06/2022    8:14 AM 04/07/2022    8:25 AM 10/05/2021   10:44 AM 06/18/2020    8:24 AM  Depression screen PHQ 2/9  Decreased Interest 0 0 0 0 0  Down, Depressed, Hopeless 0 0 0 0 0  PHQ - 2 Score 0 0 0 0 0  Altered sleeping 0 0 0 0   Tired, decreased energy 0 0 0 0   Change in appetite 0 0 0 0   Feeling bad or failure about yourself  0 0 0 0   Trouble concentrating 0 0 0 0   Moving slowly or fidgety/restless 0 0 0 0   Suicidal thoughts 0 0 0 0   PHQ-9 Score 0 0 0 0   Difficult doing work/chores Not difficult at all Not difficult at all Not difficult at all Not difficult at all     The patient does not have a history of falls. I did complete a risk assessment for falls. A plan of care for falls was documented.   Past Medical History:  Past Medical History:  Diagnosis Date   Allergy    Essential hypertension     Obesity     Surgical History:  Past Surgical History:  Procedure Laterality Date   APPENDECTOMY     BACK SURGERY     HERNIA REPAIR     SPINE SURGERY     TONSILLECTOMY     TUMOR REMOVAL  2001   benign mass removed from behind left ear   VARICOSE VEIN SURGERY Bilateral 04/12/2019    Medications:  Current Outpatient Medications on File Prior to Visit  Medication Sig   amLODipine (NORVASC) 5 MG tablet Take 1 tablet (5 mg total) by mouth daily.   ibuprofen (ADVIL,MOTRIN) 200 MG tablet Take 200 mg every 6 (six) hours as needed by mouth.   mometasone (ELOCON) 0.1 % cream Use as needed for eczema.   No current facility-administered medications on file prior to visit.    Allergies:  Allergies  Allergen Reactions   Erythromycin Anaphylaxis   Penicillins Anaphylaxis   Sulfa Antibiotics Anaphylaxis    Social History:  Social History   Socioeconomic History   Marital status: Married    Spouse name: Stacy   Number of children: 0   Years of education: Not on file   Highest education level: Not on file  Occupational History   Not on file  Tobacco Use   Smoking status: Every Day    Current packs/day: 1.00    Average packs/day: 1 pack/day for 20.0 years (20.0 ttl pk-yrs)    Types: Cigarettes    Passive exposure: Current   Smokeless tobacco: Never  Vaping Use   Vaping status: Never Used  Substance and Sexual Activity   Alcohol use: Yes    Alcohol/week: 5.0 - 6.0 standard drinks of alcohol    Types: 5 - 6 Shots of liquor per week   Drug use: No   Sexual activity: Yes    Birth control/protection: None  Other Topics Concern   Not on file  Social History Narrative   Not on file   Social Drivers of Health   Financial Resource Strain: Low Risk  (10/27/2023)   Overall Financial Resource Strain (CARDIA)    Difficulty of Paying Living Expenses: Not hard at all  Food Insecurity: Not on file  Transportation Needs: Not on file  Physical Activity: Sufficiently Active  (10/27/2023)   Exercise Vital Sign    Days of Exercise per Week: 6 days    Minutes of Exercise per Session: 30 min  Stress: No Stress Concern Present (10/27/2023)   [employer] of Occupational Health - Occupational Stress Questionnaire    Feeling of Stress : Not at all  Social Connections: Socially Isolated (10/27/2023)   Social Connection and Isolation Panel [NHANES]    Frequency of Communication with Friends and Family: Once a week    Frequency of Social Gatherings with Friends and Family: Once a week    Attends Religious Services: Never    Database administrator or Organizations: No    Attends Engineer, structural: Never    Marital Status: Married  Catering manager Violence: Not on file   Social History   Tobacco Use  Smoking Status Every Day   Current packs/day: 1.00   Average packs/day: 1 pack/day for 20.0 years (20.0 ttl pk-yrs)   Types: Cigarettes   Passive exposure: Current  Smokeless Tobacco Never   Social History   Substance and Sexual Activity  Alcohol Use Yes   Alcohol/week: 5.0 - 6.0 standard drinks of alcohol   Types: 5 - 6 Shots of liquor per week    Family History:  Family History  Problem Relation Age of Onset   Stroke Mother    Anxiety disorder Sister    Heart disease Paternal Grandfather    COPD Neg Hx    Diabetes Neg Hx    Hypertension Neg Hx     Past medical history, surgical history, medications, allergies, family history and social history reviewed with patient today and changes made to appropriate areas of the chart.   Review of Systems  Eyes:  Negative for blurred vision and double vision.  Respiratory:  Negative for shortness of breath.   Cardiovascular:  Negative for chest pain, palpitations and leg swelling.  Neurological:  Negative for dizziness and headaches.   All other ROS negative except what is listed above and in the HPI.      Objective:    BP 116/78 (BP Location: Left Arm, Patient Position: Sitting, Cuff Size:  Large)   Pulse 87   Temp 98.4 F (36.9 C) (Oral)   Resp 17   Ht 6' 3.12" (1.908 m)   Wt (!) 313 lb 12.8 oz (142.3 kg)   SpO2 99%   BMI 39.10 kg/m   Wt Readings from Last 3 Encounters:  10/27/23 (!) 313 lb 12.8 oz (142.3 kg)  04/28/23 274 lb 9.6 oz (124.6 kg)  10/06/22 281 lb 1.6 oz (127.5 kg)    Physical Exam Vitals and nursing note reviewed.  Constitutional:      General: He is not in acute distress.    Appearance: Normal appearance. He is not ill-appearing, toxic-appearing or diaphoretic.  HENT:     Head: Normocephalic.     Right Ear: Tympanic membrane, ear canal and external ear normal.     Left Ear: Tympanic membrane, ear canal and external ear normal.     Nose: Nose normal. No congestion or rhinorrhea.     Mouth/Throat:     Mouth: Mucous membranes are moist.  Eyes:     General:        Right eye: No discharge.        Left eye: No discharge.     Extraocular Movements: Extraocular movements intact.     Conjunctiva/sclera: Conjunctivae normal.     Pupils: Pupils are equal, round, and reactive to light.  Cardiovascular:     Rate and Rhythm: Normal rate and regular rhythm.     Heart sounds: No murmur heard. Pulmonary:     Effort: Pulmonary effort is normal. No respiratory distress.     Breath sounds: Normal breath sounds. No wheezing, rhonchi or rales.  Abdominal:     General: Abdomen is flat. Bowel sounds are normal. There is no distension.     Palpations: Abdomen is soft.     Tenderness: There is no abdominal tenderness. There is no guarding.  Musculoskeletal:     Cervical back: Normal range of motion and neck supple.  Skin:    General: Skin is warm and dry.     Capillary Refill: Capillary refill takes less than 2 seconds.  Neurological:     General: No focal deficit present.     Mental Status: He is alert and oriented to person, place, and time.     Cranial Nerves: No cranial nerve deficit.     Motor: No weakness.     Deep Tendon Reflexes: Reflexes normal.   Psychiatric:        Mood and Affect: Mood normal.        Behavior: Behavior normal.        Thought Content: Thought content normal.        Judgment: Judgment normal.     Results for orders placed or performed in visit on 04/28/23  Comp Met (CMET)   Collection Time: 04/28/23  2:28 PM  Result Value Ref Range   Glucose 105 (H) 70 - 99 mg/dL   BUN 8 6 - 24 mg/dL   Creatinine, Ser 4.69 (L) 0.76 - 1.27 mg/dL   eGFR 629 >52 WU/XLK/4.40   BUN/Creatinine Ratio 12 9 - 20   Sodium 144 134 - 144 mmol/L   Potassium 3.8 3.5 - 5.2 mmol/L   Chloride 103 96 - 106 mmol/L   CO2 21 20 - 29 mmol/L   Calcium 9.6 8.7 - 10.2 mg/dL   Total Protein 7.1 6.0 - 8.5 g/dL   Albumin 4.6 4.1 - 5.1 g/dL   Globulin, Total 2.5 1.5 - 4.5 g/dL   Bilirubin Total 0.5 0.0 - 1.2 mg/dL   Alkaline Phosphatase 96 44 - 121 IU/L   AST 236 (H) 0 - 40 IU/L   ALT 198 (H) 0 - 44 IU/L  Lipid Profile   Collection Time: 04/28/23  2:28 PM  Result Value Ref Range  Cholesterol, Total 203 (H) 100 - 199 mg/dL   Triglycerides 295 0 - 149 mg/dL   HDL 68 >62 mg/dL   VLDL Cholesterol Cal 20 5 - 40 mg/dL   LDL Chol Calc (NIH) 130 (H) 0 - 99 mg/dL   Chol/HDL Ratio 3.0 0.0 - 5.0 ratio      Assessment & Plan:   Problem List Items Addressed This Visit       Cardiovascular and Mediastinum   Essential hypertension   Chronic.  Controlled.  Continue with current medication regimen of Amlodipine 5mg .  Refills sent today.  Labs ordered today.  Return to clinic in 6 months for reevaluation.  Call sooner if concerns arise.          Other   Elevated LDL cholesterol level   Labs ordered at visit today.  Will make recommendations based on lab results.        Relevant Orders   Lipid panel   Other Visit Diagnoses       Annual physical exam    -  Primary   Health maintenance reviewed during visit. Declined TDAP and Colonoscopy.   Relevant Orders   TSH   Lipid panel   CBC with Differential/Platelet   Comprehensive metabolic  panel with GFR        Discussed aspirin prophylaxis for myocardial infarction prevention and decision was it was not indicated  LABORATORY TESTING:  Health maintenance labs ordered today as discussed above.     IMMUNIZATIONS:   - Tdap: Tetanus vaccination status reviewed: Refused. - Influenza: Postponed to flu season - Pneumovax: Not applicable - Prevnar: Not applicable - COVID: Not applicable - HPV: Not applicable - Shingrix vaccine: Not applicable  SCREENING: - Colonoscopy:  Refused   Discussed with patient purpose of the colonoscopy is to detect colon cancer at curable precancerous or early stages   - AAA Screening: Not applicable  -Hearing Test: Not applicable  -Spirometry: Not applicable   PATIENT COUNSELING:    Sexuality: Discussed sexually transmitted diseases, partner selection, use of condoms, avoidance of unintended pregnancy  and contraceptive alternatives.   Advised to avoid cigarette smoking.  I discussed with the patient that most people either abstain from alcohol or drink within safe limits (<=14/week and <=4 drinks/occasion for males, <=7/weeks and <= 3 drinks/occasion for females) and that the risk for alcohol disorders and other health effects rises proportionally with the number of drinks per week and how often a drinker exceeds daily limits.  Discussed cessation/primary prevention of drug use and availability of treatment for abuse.   Diet: Encouraged to adjust caloric intake to maintain  or achieve ideal body weight, to reduce intake of dietary saturated fat and total fat, to limit sodium intake by avoiding high sodium foods and not adding table salt, and to maintain adequate dietary potassium and calcium preferably from fresh fruits, vegetables, and low-fat dairy products.    stressed the importance of regular exercise  Injury prevention: Discussed safety belts, safety helmets, smoke detector, smoking near bedding or upholstery.   Dental health:  Discussed importance of regular tooth brushing, flossing, and dental visits.   Follow up plan: NEXT PREVENTATIVE PHYSICAL DUE IN 1 YEAR. Return in about 6 months (around 04/27/2024) for HTN, HLD, DM2 FU.

## 2023-10-27 NOTE — Progress Notes (Deleted)
 There were no vitals taken for this visit.   Subjective:    Patient ID: [name], male    DOB: Jun 28, 1976, 48 y.o.   MRN: [id number]  HPI: [name] is a 48 y.o. male  No chief complaint on file.  HYPERTENSION without Chronic Kidney Disease Hypertension status: controlled  Satisfied with current treatment? yes Duration of hypertension: years BP monitoring frequency:   occasional  BP range:  BP medication side effects:  no Medication compliance: excellent compliance Previous BP meds:amlodipine Aspirin: no Recurrent headaches: no Visual changes: no Palpitations: no Dyspnea: no Chest pain: no Lower extremity edema: no Dizzy/lightheaded: no  Relevant past medical, surgical, family and social history reviewed and updated as indicated. Interim medical history since our last visit reviewed. Allergies and medications reviewed and updated.  Review of Systems  Eyes:  Negative for visual disturbance.  Respiratory:  Negative for chest tightness and shortness of breath.   Cardiovascular:  Negative for chest pain, palpitations and leg swelling.  Endocrine: Negative for polydipsia and polyuria.  Neurological:  Negative for dizziness, light-headedness, numbness and headaches.    Per HPI unless specifically indicated above     Objective:    There were no vitals taken for this visit.  Wt Readings from Last 3 Encounters:  04/28/23 274 lb 9.6 oz (124.6 kg)  10/06/22 281 lb 1.6 oz (127.5 kg)  04/07/22 289 lb 8 oz (131.3 kg)    Physical Exam Vitals and nursing note reviewed.  Constitutional:      General: He is not in acute distress.    Appearance: Normal appearance. He is not ill-appearing, toxic-appearing or diaphoretic.  HENT:     Head: Normocephalic.     Right Ear: External ear normal.     Left Ear: External ear normal.     Nose: Nose normal. No congestion or rhinorrhea.     Mouth/Throat:     Mouth: Mucous membranes are moist.  Eyes:     General:         Right eye: No discharge.        Left eye: No discharge.     Extraocular Movements: Extraocular movements intact.     Conjunctiva/sclera: Conjunctivae normal.     Pupils: Pupils are equal, round, and reactive to light.  Cardiovascular:     Rate and Rhythm: Normal rate and regular rhythm.     Heart sounds: No murmur heard. Pulmonary:     Effort: Pulmonary effort is normal. No respiratory distress.     Breath sounds: Normal breath sounds. No wheezing, rhonchi or rales.  Abdominal:     General: Abdomen is flat. Bowel sounds are normal.  Musculoskeletal:     Cervical back: Normal range of motion and neck supple.  Skin:    General: Skin is warm and dry.     Capillary Refill: Capillary refill takes less than 2 seconds.  Neurological:     General: No focal deficit present.     Mental Status: He is alert and oriented to person, place, and time.  Psychiatric:        Mood and Affect: Mood normal.        Behavior: Behavior normal.        Thought Content: Thought content normal.        Judgment: Judgment normal.    Results for orders placed or performed in visit on 04/28/23  Comp Met (CMET)   Collection Time: 04/28/23  2:28 PM  Result Value Ref Range  Glucose 105 (H) 70 - 99 mg/dL   BUN 8 6 - 24 mg/dL   Creatinine, Ser 1.61 (L) 0.76 - 1.27 mg/dL   eGFR 096 >04 VW/UJW/1.19   BUN/Creatinine Ratio 12 9 - 20   Sodium 144 134 - 144 mmol/L   Potassium 3.8 3.5 - 5.2 mmol/L   Chloride 103 96 - 106 mmol/L   CO2 21 20 - 29 mmol/L   Calcium 9.6 8.7 - 10.2 mg/dL   Total Protein 7.1 6.0 - 8.5 g/dL   Albumin 4.6 4.1 - 5.1 g/dL   Globulin, Total 2.5 1.5 - 4.5 g/dL   Bilirubin Total 0.5 0.0 - 1.2 mg/dL   Alkaline Phosphatase 96 44 - 121 IU/L   AST 236 (H) 0 - 40 IU/L   ALT 198 (H) 0 - 44 IU/L  Lipid Profile   Collection Time: 04/28/23  2:28 PM  Result Value Ref Range   Cholesterol, Total 203 (H) 100 - 199 mg/dL   Triglycerides 147 0 - 149 mg/dL   HDL 68 >82 mg/dL   VLDL Cholesterol Cal  20 5 - 40 mg/dL   LDL Chol Calc (NIH) 956 (H) 0 - 99 mg/dL   Chol/HDL Ratio 3.0 0.0 - 5.0 ratio      Assessment & Plan:   Problem List Items Addressed This Visit       Cardiovascular and Mediastinum   Essential hypertension - Primary     Other   Elevated LDL cholesterol level     Follow up plan: No follow-ups on file.

## 2023-10-28 ENCOUNTER — Encounter: Payer: Self-pay | Admitting: Nurse Practitioner

## 2023-10-28 LAB — CBC WITH DIFFERENTIAL/PLATELET
Basophils Absolute: 0.1 10*3/uL (ref 0.0–0.2)
Basos: 1 %
EOS (ABSOLUTE): 0.2 10*3/uL (ref 0.0–0.4)
Eos: 4 %
Hematocrit: 51.2 % — ABNORMAL HIGH (ref 37.5–51.0)
Hemoglobin: 17.4 g/dL (ref 13.0–17.7)
Immature Grans (Abs): 0 10*3/uL (ref 0.0–0.1)
Immature Granulocytes: 1 %
Lymphocytes Absolute: 1.6 10*3/uL (ref 0.7–3.1)
Lymphs: 27 %
MCH: 33.6 pg — ABNORMAL HIGH (ref 26.6–33.0)
MCHC: 34 g/dL (ref 31.5–35.7)
MCV: 99 fL — ABNORMAL HIGH (ref 79–97)
Monocytes Absolute: 0.5 10*3/uL (ref 0.1–0.9)
Monocytes: 9 %
Neutrophils Absolute: 3.5 10*3/uL (ref 1.4–7.0)
Neutrophils: 58 %
Platelets: 208 10*3/uL (ref 150–450)
RBC: 5.18 x10E6/uL (ref 4.14–5.80)
RDW: 13 % (ref 11.6–15.4)
WBC: 5.9 10*3/uL (ref 3.4–10.8)

## 2023-10-28 LAB — COMPREHENSIVE METABOLIC PANEL WITH GFR
ALT: 43 IU/L (ref 0–44)
AST: 42 IU/L — ABNORMAL HIGH (ref 0–40)
Albumin: 4.5 g/dL (ref 4.1–5.1)
Alkaline Phosphatase: 81 IU/L (ref 44–121)
BUN/Creatinine Ratio: 14 (ref 9–20)
BUN: 10 mg/dL (ref 6–24)
Bilirubin Total: 0.3 mg/dL (ref 0.0–1.2)
CO2: 22 mmol/L (ref 20–29)
Calcium: 9.4 mg/dL (ref 8.7–10.2)
Chloride: 102 mmol/L (ref 96–106)
Creatinine, Ser: 0.7 mg/dL — ABNORMAL LOW (ref 0.76–1.27)
Globulin, Total: 2.4 g/dL (ref 1.5–4.5)
Glucose: 100 mg/dL — ABNORMAL HIGH (ref 70–99)
Potassium: 4.2 mmol/L (ref 3.5–5.2)
Sodium: 140 mmol/L (ref 134–144)
Total Protein: 6.9 g/dL (ref 6.0–8.5)
eGFR: 113 mL/min/{1.73_m2} (ref >59)

## 2023-10-28 LAB — LIPID PANEL
Chol/HDL Ratio: 3.9 ratio (ref 0.0–5.0)
Cholesterol, Total: 189 mg/dL (ref 100–199)
HDL: 48 mg/dL (ref >39)
LDL Chol Calc (NIH): 99 mg/dL (ref 0–99)
Triglycerides: 249 mg/dL — ABNORMAL HIGH (ref 0–149)
VLDL Cholesterol Cal: 42 mg/dL — ABNORMAL HIGH (ref 5–40)

## 2023-10-28 LAB — TSH: TSH: 3.76 u[IU]/mL (ref 0.450–4.500)

## 2024-01-07 ENCOUNTER — Other Ambulatory Visit: Payer: Self-pay | Admitting: Nurse Practitioner

## 2024-01-09 NOTE — Telephone Encounter (Signed)
 Requested Prescriptions  Pending Prescriptions Disp Refills   amLODipine  (NORVASC ) 5 MG tablet [Pharmacy Med Name: AMLODIPINE  BESYLATE 5 MG TAB] 90 tablet 1    Sig: TAKE 1 TABLET (5 MG TOTAL) BY MOUTH DAILY.     Cardiovascular: Calcium Channel Blockers 2 Passed - 01/09/2024  4:17 PM      Passed - Last BP in normal range    BP Readings from Last 1 Encounters:  10/27/23 116/78         Passed - Last Heart Rate in normal range    Pulse Readings from Last 1 Encounters:  10/27/23 87         Passed - Valid encounter within last 6 months    Recent Outpatient Visits           2 months ago Annual physical exam   [facility] Melvin Pao, NP

## 2024-04-03 ENCOUNTER — Encounter: Payer: Self-pay | Admitting: Nurse Practitioner

## 2024-04-24 ENCOUNTER — Ambulatory Visit (INDEPENDENT_AMBULATORY_CARE_PROVIDER_SITE_OTHER): Admitting: Nurse Practitioner

## 2024-04-24 ENCOUNTER — Encounter: Payer: Self-pay | Admitting: Nurse Practitioner

## 2024-04-24 VITALS — BP 132/87 | HR 91 | Temp 99.2°F | Ht 75.0 in | Wt 311.8 lb

## 2024-04-24 DIAGNOSIS — I10 Essential (primary) hypertension: Secondary | ICD-10-CM

## 2024-04-24 DIAGNOSIS — Z23 Encounter for immunization: Secondary | ICD-10-CM

## 2024-04-24 DIAGNOSIS — E7800 Pure hypercholesterolemia, unspecified: Secondary | ICD-10-CM | POA: Diagnosis not present

## 2024-04-24 MED ORDER — AMLODIPINE BESYLATE 5 MG PO TABS: 5.0000 mg | ORAL_TABLET | Freq: Every day | ORAL | 1 refills | Status: AC

## 2024-04-24 NOTE — Assessment & Plan Note (Signed)
 Labs ordered at visit today.  Will make recommendations based on lab results.

## 2024-04-24 NOTE — Progress Notes (Signed)
 BP 132/87   Pulse 91   Temp 99.2 F (37.3 C) (Oral)   Ht 6' 3 (1.905 m)   Wt (!) 311 lb 12.8 oz (141.4 kg)   SpO2 98%   BMI 38.97 kg/m    Subjective:    Patient ID: [name], male    DOB: 1976-06-28, 48 y.o.   MRN: [id number]  HPI: [name] is a 48 y.o. male  Chief Complaint  Patient presents with   Hypertension   HYPERTENSION / HYPERLIPIDEMIA Patient states he has switched to celtic sea salt and it has shown an improvement in his blood pressure.   Satisfied with current treatment? yes Duration of hypertension: years BP monitoring frequency: not checking BP range:  BP medication side effects: no Past BP meds: amlodipine  Duration of hyperlipidemia: years Cholesterol medication side effects: no Cholesterol supplements: none Past cholesterol medications: none Medication compliance: excellent compliance Aspirin: no Recent stressors: no Recurrent headaches: no Visual changes: no Palpitations: no Dyspnea: no Chest pain: no Lower extremity edema: no Dizzy/lightheaded: no  Patient states he is still smoking about 1ppd.  He was using nicoderm.     Patient states he has eczema in his hairline, ears, and eyebrows.  He has been using mometasome cream and that has been helping his symptoms. Has been well controlled.     Relevant past medical, surgical, family and social history reviewed and updated as indicated. Interim medical history since our last visit reviewed. Allergies and medications reviewed and updated.  Review of Systems  Eyes:  Negative for visual disturbance.  Respiratory:  Negative for chest tightness and shortness of breath.   Cardiovascular:  Negative for chest pain, palpitations and leg swelling.  Skin:  Positive for rash.  Neurological:  Negative for dizziness, light-headedness and headaches.    Per HPI unless specifically indicated above     Objective:    BP 132/87   Pulse 91   Temp 99.2 F (37.3 C) (Oral)   Ht 6' 3  (1.905 m)   Wt (!) 311 lb 12.8 oz (141.4 kg)   SpO2 98%   BMI 38.97 kg/m   Wt Readings from Last 3 Encounters:  04/24/24 (!) 311 lb 12.8 oz (141.4 kg)  10/27/23 (!) 313 lb 12.8 oz (142.3 kg)  04/28/23 274 lb 9.6 oz (124.6 kg)    Physical Exam Vitals and nursing note reviewed.  Constitutional:      General: He is not in acute distress.    Appearance: Normal appearance. He is not ill-appearing, toxic-appearing or diaphoretic.  HENT:     Head: Normocephalic.     Right Ear: External ear normal.     Left Ear: External ear normal.     Nose: Nose normal. No congestion or rhinorrhea.     Mouth/Throat:     Mouth: Mucous membranes are moist.  Eyes:     General:        Right eye: No discharge.        Left eye: No discharge.     Extraocular Movements: Extraocular movements intact.     Conjunctiva/sclera: Conjunctivae normal.     Pupils: Pupils are equal, round, and reactive to light.  Cardiovascular:     Rate and Rhythm: Normal rate and regular rhythm.     Heart sounds: No murmur heard. Pulmonary:     Effort: Pulmonary effort is normal. No respiratory distress.     Breath sounds: Normal breath sounds. No wheezing, rhonchi or rales.  Abdominal:  General: Abdomen is flat. Bowel sounds are normal.  Musculoskeletal:     Cervical back: Normal range of motion and neck supple.  Skin:    General: Skin is warm and dry.     Capillary Refill: Capillary refill takes less than 2 seconds.  Neurological:     General: No focal deficit present.     Mental Status: He is alert and oriented to person, place, and time.  Psychiatric:        Mood and Affect: Mood normal.        Behavior: Behavior normal.        Thought Content: Thought content normal.        Judgment: Judgment normal.     Results for orders placed or performed in visit on 10/27/23  TSH   Collection Time: 10/27/23  9:08 AM  Result Value Ref Range   TSH 3.760 0.450 - 4.500 uIU/mL  Lipid panel   Collection Time: 10/27/23   9:08 AM  Result Value Ref Range   Cholesterol, Total 189 100 - 199 mg/dL   Triglycerides 750 (H) 0 - 149 mg/dL   HDL 48 >60 mg/dL   VLDL Cholesterol Cal 42 (H) 5 - 40 mg/dL   LDL Chol Calc (NIH) 99 0 - 99 mg/dL   Chol/HDL Ratio 3.9 0.0 - 5.0 ratio  CBC with Differential/Platelet   Collection Time: 10/27/23  9:08 AM  Result Value Ref Range   WBC 5.9 3.4 - 10.8 x10E3/uL   RBC 5.18 4.14 - 5.80 x10E6/uL   Hemoglobin 17.4 13.0 - 17.7 g/dL   Hematocrit 48.7 (H) 62.4 - 51.0 %   MCV 99 (H) 79 - 97 fL   MCH 33.6 (H) 26.6 - 33.0 pg   MCHC 34.0 31.5 - 35.7 g/dL   RDW 86.9 88.3 - 84.5 %   Platelets 208 150 - 450 x10E3/uL   Neutrophils 58 Not Estab. %   Lymphs 27 Not Estab. %   Monocytes 9 Not Estab. %   Eos 4 Not Estab. %   Basos 1 Not Estab. %   Neutrophils Absolute 3.5 1.4 - 7.0 x10E3/uL   Lymphocytes Absolute 1.6 0.7 - 3.1 x10E3/uL   Monocytes Absolute 0.5 0.1 - 0.9 x10E3/uL   EOS (ABSOLUTE) 0.2 0.0 - 0.4 x10E3/uL   Basophils Absolute 0.1 0.0 - 0.2 x10E3/uL   Immature Granulocytes 1 Not Estab. %   Immature Grans (Abs) 0.0 0.0 - 0.1 x10E3/uL  Comprehensive metabolic panel with GFR   Collection Time: 10/27/23  9:08 AM  Result Value Ref Range   Glucose 100 (H) 70 - 99 mg/dL   BUN 10 6 - 24 mg/dL   Creatinine, Ser 9.29 (L) 0.76 - 1.27 mg/dL   eGFR 886 >40 fO/fpw/8.26   BUN/Creatinine Ratio 14 9 - 20   Sodium 140 134 - 144 mmol/L   Potassium 4.2 3.5 - 5.2 mmol/L   Chloride 102 96 - 106 mmol/L   CO2 22 20 - 29 mmol/L   Calcium 9.4 8.7 - 10.2 mg/dL   Total Protein 6.9 6.0 - 8.5 g/dL   Albumin 4.5 4.1 - 5.1 g/dL   Globulin, Total 2.4 1.5 - 4.5 g/dL   Bilirubin Total 0.3 0.0 - 1.2 mg/dL   Alkaline Phosphatase 81 44 - 121 IU/L   AST 42 (H) 0 - 40 IU/L   ALT 43 0 - 44 IU/L      Assessment & Plan:   Problem List Items Addressed This Visit  Cardiovascular and Mediastinum   Essential hypertension - Primary   Chronic.  Controlled.  Continue with current medication regimen of  Amlodipine  5mg .  Refills sent today.  Labs ordered today.  Return to clinic in 6 months for reevaluation.  Call sooner if concerns arise.       Relevant Medications   amLODipine  (NORVASC ) 5 MG tablet   Other Relevant Orders   Comprehensive metabolic panel with GFR     Other   Elevated LDL cholesterol level   Labs ordered at visit today.  Will make recommendations based on lab results.       Relevant Orders   Lipid panel   Other Visit Diagnoses       Need for influenza vaccination       Relevant Orders   Tdap vaccine greater than or equal to 7yo IM   Flu vaccine trivalent PF, 6mos and older(Flulaval,Afluria,Fluarix,Fluzone)     Need for tetanus booster       Relevant Orders   Tdap vaccine greater than or equal to 7yo IM        Follow up plan: Return in about 6 months (around 10/23/2024) for Physical and Fasting labs.

## 2024-04-24 NOTE — Assessment & Plan Note (Signed)
Chronic.  Controlled.  Continue with current medication regimen of Amlodipine 5mg.  Refills sent today.  Labs ordered today.  Return to clinic in 6 months for reevaluation.  Call sooner if concerns arise.   

## 2024-04-25 ENCOUNTER — Ambulatory Visit: Payer: Self-pay | Admitting: Nurse Practitioner

## 2024-04-25 LAB — COMPREHENSIVE METABOLIC PANEL WITH GFR
ALT: 39 IU/L (ref 0–44)
AST: 29 IU/L (ref 0–40)
Albumin: 4.2 g/dL (ref 4.1–5.1)
Alkaline Phosphatase: 83 IU/L (ref 47–123)
BUN/Creatinine Ratio: 13 (ref 9–20)
BUN: 10 mg/dL (ref 6–24)
Bilirubin Total: 0.4 mg/dL (ref 0.0–1.2)
CO2: 20 mmol/L (ref 20–29)
Calcium: 9.1 mg/dL (ref 8.7–10.2)
Chloride: 103 mmol/L (ref 96–106)
Creatinine, Ser: 0.75 mg/dL — ABNORMAL LOW (ref 0.76–1.27)
Globulin, Total: 2.5 g/dL (ref 1.5–4.5)
Glucose: 108 mg/dL — ABNORMAL HIGH (ref 70–99)
Potassium: 4 mmol/L (ref 3.5–5.2)
Sodium: 139 mmol/L (ref 134–144)
Total Protein: 6.7 g/dL (ref 6.0–8.5)
eGFR: 110 mL/min/1.73 (ref >59)

## 2024-04-25 LAB — LIPID PANEL
Chol/HDL Ratio: 3.8 ratio (ref 0.0–5.0)
Cholesterol, Total: 164 mg/dL (ref 100–199)
HDL: 43 mg/dL (ref >39)
LDL Chol Calc (NIH): 92 mg/dL (ref 0–99)
Triglycerides: 165 mg/dL — ABNORMAL HIGH (ref 0–149)
VLDL Cholesterol Cal: 29 mg/dL (ref 5–40)

## 2024-04-27 ENCOUNTER — Ambulatory Visit: Admitting: Nurse Practitioner

## 2024-10-29 ENCOUNTER — Encounter: Admitting: Nurse Practitioner
